# Patient Record
Sex: Female | Born: 1970 | Race: Black or African American | Hispanic: No | Smoking: Never smoker
Health system: Southern US, Community
[De-identification: ages and names within clinical notes are randomized; demographics above are authoritative.]

## PROBLEM LIST (undated history)

## (undated) DIAGNOSIS — Z789 Other specified health status: Secondary | ICD-10-CM

## (undated) HISTORY — PX: TUBAL LIGATION: SHX77

---

## 2004-03-31 ENCOUNTER — Emergency Department (HOSPITAL_COMMUNITY): Admission: EM | Admit: 2004-03-31 | Discharge: 2004-03-31 | Payer: Self-pay | Admitting: Emergency Medicine

## 2004-04-17 ENCOUNTER — Emergency Department (HOSPITAL_COMMUNITY): Admission: EM | Admit: 2004-04-17 | Discharge: 2004-04-17 | Payer: Self-pay | Admitting: Emergency Medicine

## 2004-05-12 ENCOUNTER — Emergency Department (HOSPITAL_COMMUNITY): Admission: EM | Admit: 2004-05-12 | Discharge: 2004-05-13 | Payer: Self-pay | Admitting: Emergency Medicine

## 2004-08-29 ENCOUNTER — Emergency Department (HOSPITAL_COMMUNITY): Admission: EM | Admit: 2004-08-29 | Discharge: 2004-08-30 | Payer: Self-pay | Admitting: Emergency Medicine

## 2005-12-19 ENCOUNTER — Emergency Department (HOSPITAL_COMMUNITY): Admission: EM | Admit: 2005-12-19 | Discharge: 2005-12-19 | Payer: Self-pay | Admitting: Emergency Medicine

## 2006-07-05 ENCOUNTER — Emergency Department (HOSPITAL_COMMUNITY): Admission: EM | Admit: 2006-07-05 | Discharge: 2006-07-05 | Payer: Self-pay | Admitting: Emergency Medicine

## 2006-10-01 ENCOUNTER — Emergency Department (HOSPITAL_COMMUNITY): Admission: EM | Admit: 2006-10-01 | Discharge: 2006-10-01 | Payer: Self-pay | Admitting: Emergency Medicine

## 2007-01-07 ENCOUNTER — Emergency Department (HOSPITAL_COMMUNITY): Admission: EM | Admit: 2007-01-07 | Discharge: 2007-01-08 | Payer: Self-pay | Admitting: Emergency Medicine

## 2007-01-08 ENCOUNTER — Ambulatory Visit (HOSPITAL_COMMUNITY): Admission: RE | Admit: 2007-01-08 | Discharge: 2007-01-08 | Payer: Self-pay | Admitting: Emergency Medicine

## 2007-08-04 ENCOUNTER — Emergency Department (HOSPITAL_COMMUNITY): Admission: EM | Admit: 2007-08-04 | Discharge: 2007-08-04 | Payer: Self-pay | Admitting: Emergency Medicine

## 2007-11-24 ENCOUNTER — Emergency Department (HOSPITAL_COMMUNITY): Admission: EM | Admit: 2007-11-24 | Discharge: 2007-11-24 | Payer: Self-pay | Admitting: Family Medicine

## 2007-12-15 ENCOUNTER — Ambulatory Visit: Payer: Self-pay | Admitting: Nurse Practitioner

## 2007-12-15 DIAGNOSIS — T7840XA Allergy, unspecified, initial encounter: Secondary | ICD-10-CM | POA: Insufficient documentation

## 2007-12-15 DIAGNOSIS — T781XXA Other adverse food reactions, not elsewhere classified, initial encounter: Secondary | ICD-10-CM

## 2008-01-05 ENCOUNTER — Ambulatory Visit: Payer: Self-pay | Admitting: Nurse Practitioner

## 2008-01-05 ENCOUNTER — Other Ambulatory Visit: Admission: RE | Admit: 2008-01-05 | Discharge: 2008-01-05 | Payer: Self-pay | Admitting: Internal Medicine

## 2008-01-05 LAB — CONVERTED CEMR LAB
Bilirubin Urine: NEGATIVE
Blood in Urine, dipstick: NEGATIVE
Glucose, Urine, Semiquant: NEGATIVE
KOH Prep: NEGATIVE
Ketones, urine, test strip: NEGATIVE
Nitrite: NEGATIVE
Specific Gravity, Urine: >=1.03
Urobilinogen, UA: 0.2
WBC Urine, dipstick: NEGATIVE
pH: 5

## 2008-01-12 ENCOUNTER — Encounter (INDEPENDENT_AMBULATORY_CARE_PROVIDER_SITE_OTHER): Payer: Self-pay | Admitting: Nurse Practitioner

## 2008-01-12 DIAGNOSIS — E7800 Pure hypercholesterolemia, unspecified: Secondary | ICD-10-CM

## 2008-01-12 LAB — CONVERTED CEMR LAB
ALT: 18 units/L (ref 0–35)
AST: 17 units/L (ref 0–37)
Albumin: 4.2 g/dL (ref 3.5–5.2)
Alkaline Phosphatase: 51 units/L (ref 39–117)
BUN: 10 mg/dL (ref 6–23)
Basophils Absolute: 0 10*3/uL (ref 0.0–0.1)
Basophils Relative: 1 % (ref 0–1)
CO2: 23 meq/L (ref 19–32)
Calcium: 9.1 mg/dL (ref 8.4–10.5)
Chlamydia, DNA Probe: NEGATIVE
Chloride: 107 meq/L (ref 96–112)
Cholesterol: 223 mg/dL — ABNORMAL HIGH (ref 0–200)
Creatinine, Ser: 0.92 mg/dL (ref 0.40–1.20)
Eosinophils Absolute: 0 10*3/uL (ref 0.0–0.7)
Eosinophils Relative: 2 % (ref 0–5)
GC Probe Amp, Genital: NEGATIVE
Glucose, Bld: 76 mg/dL (ref 70–99)
HCT: 37.4 % (ref 36.0–46.0)
HDL: 63 mg/dL (ref >39)
Hemoglobin: 12.2 g/dL (ref 12.0–15.0)
LDL Cholesterol: 141 mg/dL — ABNORMAL HIGH (ref 0–99)
Lymphocytes Relative: 48 % — ABNORMAL HIGH (ref 12–46)
Lymphs Abs: 1.2 10*3/uL (ref 0.7–4.0)
MCHC: 32.6 g/dL (ref 30.0–36.0)
MCV: 93.3 fL (ref 78.0–100.0)
Monocytes Absolute: 0.2 10*3/uL (ref 0.1–1.0)
Monocytes Relative: 7 % (ref 3–12)
Neutro Abs: 1.1 10*3/uL — ABNORMAL LOW (ref 1.7–7.7)
Neutrophils Relative %: 42 % — ABNORMAL LOW (ref 43–77)
Platelets: 261 10*3/uL (ref 150–400)
Potassium: 4.4 meq/L (ref 3.5–5.3)
RBC: 4.01 M/uL (ref 3.87–5.11)
RDW: 13.3 % (ref 11.5–15.5)
Sodium: 142 meq/L (ref 135–145)
TSH: 1.216 microintl units/mL (ref 0.350–5.50)
Total Bilirubin: 1.6 mg/dL — ABNORMAL HIGH (ref 0.3–1.2)
Total CHOL/HDL Ratio: 3.5
Total Protein: 7 g/dL (ref 6.0–8.3)
Triglycerides: 94 mg/dL (ref <150)
VLDL: 19 mg/dL (ref 0–40)
WBC: 2.6 10*3/uL — ABNORMAL LOW (ref 4.0–10.5)

## 2008-02-22 ENCOUNTER — Ambulatory Visit: Payer: Self-pay | Admitting: Nurse Practitioner

## 2008-03-11 ENCOUNTER — Emergency Department (HOSPITAL_COMMUNITY): Admission: EM | Admit: 2008-03-11 | Discharge: 2008-03-11 | Payer: Self-pay | Admitting: Emergency Medicine

## 2008-03-24 ENCOUNTER — Ambulatory Visit: Payer: Self-pay | Admitting: Nurse Practitioner

## 2008-03-24 DIAGNOSIS — R5381 Other malaise: Secondary | ICD-10-CM

## 2008-03-24 DIAGNOSIS — R5383 Other fatigue: Secondary | ICD-10-CM

## 2008-03-24 DIAGNOSIS — N926 Irregular menstruation, unspecified: Secondary | ICD-10-CM | POA: Insufficient documentation

## 2008-03-24 DIAGNOSIS — R110 Nausea: Secondary | ICD-10-CM

## 2008-03-24 LAB — CONVERTED CEMR LAB
Beta hcg, urine, semiquantitative: NEGATIVE
Bilirubin Urine: NEGATIVE
Blood in Urine, dipstick: NEGATIVE
Glucose, Urine, Semiquant: NEGATIVE
Nitrite: NEGATIVE
Specific Gravity, Urine: >=1.03
Urobilinogen, UA: 1
WBC Urine, dipstick: NEGATIVE
pH: 5

## 2008-03-28 ENCOUNTER — Encounter (INDEPENDENT_AMBULATORY_CARE_PROVIDER_SITE_OTHER): Payer: Self-pay | Admitting: Nurse Practitioner

## 2008-03-28 LAB — CONVERTED CEMR LAB
Basophils Absolute: 0 10*3/uL (ref 0.0–0.1)
Basophils Relative: 1 % (ref 0–1)
Calcium: 10.1 mg/dL (ref 8.4–10.5)
Eosinophils Absolute: 0.1 10*3/uL (ref 0.0–0.7)
Eosinophils Relative: 2 % (ref 0–5)
HCT: 37.4 % (ref 36.0–46.0)
Hemoglobin: 12.4 g/dL (ref 12.0–15.0)
Iron: 62 ug/dL (ref 42–145)
Lymphocytes Relative: 45 % (ref 12–46)
Lymphs Abs: 1.8 10*3/uL (ref 0.7–4.0)
MCHC: 33.2 g/dL (ref 30.0–36.0)
MCV: 93 fL (ref 78.0–100.0)
Monocytes Absolute: 0.2 10*3/uL (ref 0.1–1.0)
Monocytes Relative: 6 % (ref 3–12)
Neutro Abs: 1.8 10*3/uL (ref 1.7–7.7)
Neutrophils Relative %: 47 % (ref 43–77)
Platelets: 298 10*3/uL (ref 150–400)
RBC: 4.02 M/uL (ref 3.87–5.11)
RDW: 13.1 % (ref 11.5–15.5)
Retic Ct Pct: 1.1 % (ref 0.4–3.1)
Saturation Ratios: 20 % (ref 20–55)
TIBC: 317 ug/dL (ref 250–470)
UIBC: 255 ug/dL
WBC: 3.9 10*3/uL — ABNORMAL LOW (ref 4.0–10.5)

## 2008-05-24 ENCOUNTER — Ambulatory Visit: Payer: Self-pay | Admitting: Obstetrics and Gynecology

## 2008-05-24 ENCOUNTER — Encounter: Payer: Self-pay | Admitting: Obstetrics and Gynecology

## 2008-05-30 ENCOUNTER — Ambulatory Visit (HOSPITAL_COMMUNITY): Admission: RE | Admit: 2008-05-30 | Discharge: 2008-05-30 | Payer: Self-pay | Admitting: Obstetrics and Gynecology

## 2008-07-17 ENCOUNTER — Emergency Department (HOSPITAL_COMMUNITY): Admission: EM | Admit: 2008-07-17 | Discharge: 2008-07-17 | Payer: Self-pay | Admitting: Family Medicine

## 2008-07-31 ENCOUNTER — Ambulatory Visit: Payer: Self-pay | Admitting: Nurse Practitioner

## 2008-07-31 LAB — CONVERTED CEMR LAB
Beta hcg, urine, semiquantitative: NEGATIVE
Bilirubin Urine: NEGATIVE
Blood in Urine, dipstick: NEGATIVE
Glucose, Urine, Semiquant: NEGATIVE
Ketones, urine, test strip: NEGATIVE
Nitrite: NEGATIVE
Protein, U semiquant: NEGATIVE
Specific Gravity, Urine: >=1.03
Urobilinogen, UA: 0.2
WBC Urine, dipstick: NEGATIVE
pH: 5

## 2008-12-12 ENCOUNTER — Emergency Department (HOSPITAL_COMMUNITY): Admission: EM | Admit: 2008-12-12 | Discharge: 2008-12-12 | Payer: Self-pay | Admitting: Emergency Medicine

## 2008-12-12 DIAGNOSIS — K5732 Diverticulitis of large intestine without perforation or abscess without bleeding: Secondary | ICD-10-CM | POA: Insufficient documentation

## 2009-01-15 ENCOUNTER — Ambulatory Visit: Payer: Self-pay | Admitting: Nurse Practitioner

## 2009-02-21 ENCOUNTER — Ambulatory Visit: Payer: Self-pay | Admitting: Nurse Practitioner

## 2009-02-21 ENCOUNTER — Encounter (INDEPENDENT_AMBULATORY_CARE_PROVIDER_SITE_OTHER): Payer: Self-pay | Admitting: Nurse Practitioner

## 2009-02-21 ENCOUNTER — Other Ambulatory Visit: Admission: RE | Admit: 2009-02-21 | Discharge: 2009-02-21 | Payer: Self-pay | Admitting: Family Medicine

## 2009-02-21 LAB — CONVERTED CEMR LAB
Ketones, urine, test strip: NEGATIVE
Nitrite: NEGATIVE
Urobilinogen, UA: 0.2

## 2009-02-22 ENCOUNTER — Encounter (INDEPENDENT_AMBULATORY_CARE_PROVIDER_SITE_OTHER): Payer: Self-pay | Admitting: Nurse Practitioner

## 2009-02-22 LAB — CONVERTED CEMR LAB
AST: 29 units/L (ref 0–37)
Alkaline Phosphatase: 59 units/L (ref 39–117)
Basophils Absolute: 0 10*3/uL (ref 0.0–0.1)
Calcium: 9.3 mg/dL (ref 8.4–10.5)
Cholesterol: 220 mg/dL — ABNORMAL HIGH (ref 0–200)
GC Probe Amp, Genital: NEGATIVE
Glucose, Bld: 88 mg/dL (ref 70–99)
HCT: 35.2 % — ABNORMAL LOW (ref 36.0–46.0)
HDL: 63 mg/dL (ref >39)
Hemoglobin: 12 g/dL (ref 12.0–15.0)
Lymphocytes Relative: 43 % (ref 12–46)
Monocytes Absolute: 0.2 10*3/uL (ref 0.1–1.0)
Monocytes Relative: 6 % (ref 3–12)
Neutro Abs: 1.7 10*3/uL (ref 1.7–7.7)
Platelets: 221 10*3/uL (ref 150–400)
Potassium: 4.2 meq/L (ref 3.5–5.3)
Sodium: 137 meq/L (ref 135–145)
Total CHOL/HDL Ratio: 3.5
VLDL: 17 mg/dL (ref 0–40)
WBC: 3.6 10*3/uL — ABNORMAL LOW (ref 4.0–10.5)

## 2009-02-23 ENCOUNTER — Ambulatory Visit: Payer: Self-pay | Admitting: Nurse Practitioner

## 2009-02-28 ENCOUNTER — Ambulatory Visit: Payer: Self-pay | Admitting: Nurse Practitioner

## 2009-02-28 DIAGNOSIS — D72829 Elevated white blood cell count, unspecified: Secondary | ICD-10-CM | POA: Insufficient documentation

## 2009-03-15 LAB — CONVERTED CEMR LAB
Anti Nuclear Antibody(ANA): NEGATIVE
Eosinophils Absolute: 0.1 10*3/uL (ref 0.0–0.7)
Eosinophils Relative: 2 % (ref 0–5)
HCT: 35 % — ABNORMAL LOW (ref 36.0–46.0)
Lymphocytes Relative: 47 % — ABNORMAL HIGH (ref 12–46)
Lymphs Abs: 1.9 10*3/uL (ref 0.7–4.0)
MCHC: 34.9 g/dL (ref 30.0–36.0)
MCV: 89.5 fL (ref 78.0–100.0)
Monocytes Absolute: 0.3 10*3/uL (ref 0.1–1.0)
Monocytes Relative: 8 % (ref 3–12)
RBC: 3.91 M/uL (ref 3.87–5.11)
Vit D, 25-Hydroxy: 16 ng/mL — ABNORMAL LOW (ref 30–89)
WBC: 4.1 10*3/uL (ref 4.0–10.5)

## 2009-05-24 ENCOUNTER — Ambulatory Visit: Payer: Self-pay | Admitting: Nurse Practitioner

## 2009-05-24 DIAGNOSIS — E559 Vitamin D deficiency, unspecified: Secondary | ICD-10-CM | POA: Insufficient documentation

## 2009-05-24 DIAGNOSIS — M779 Enthesopathy, unspecified: Secondary | ICD-10-CM | POA: Insufficient documentation

## 2009-09-27 ENCOUNTER — Ambulatory Visit: Payer: Self-pay | Admitting: Nurse Practitioner

## 2009-09-28 ENCOUNTER — Encounter (INDEPENDENT_AMBULATORY_CARE_PROVIDER_SITE_OTHER): Payer: Self-pay | Admitting: *Deleted

## 2009-09-28 LAB — CONVERTED CEMR LAB: Vit D, 25-Hydroxy: 27 ng/mL — ABNORMAL LOW (ref 30–89)

## 2009-10-08 ENCOUNTER — Telehealth (INDEPENDENT_AMBULATORY_CARE_PROVIDER_SITE_OTHER): Payer: Self-pay | Admitting: Nurse Practitioner

## 2009-10-15 ENCOUNTER — Emergency Department (HOSPITAL_COMMUNITY): Admission: EM | Admit: 2009-10-15 | Discharge: 2009-10-15 | Payer: Self-pay | Admitting: Family Medicine

## 2009-11-02 ENCOUNTER — Emergency Department (HOSPITAL_COMMUNITY): Admission: EM | Admit: 2009-11-02 | Discharge: 2009-11-02 | Payer: Self-pay | Admitting: Family Medicine

## 2009-11-05 ENCOUNTER — Emergency Department (HOSPITAL_COMMUNITY): Admission: EM | Admit: 2009-11-05 | Discharge: 2009-11-05 | Payer: Self-pay | Admitting: Family Medicine

## 2009-11-12 ENCOUNTER — Emergency Department (HOSPITAL_COMMUNITY): Admission: EM | Admit: 2009-11-12 | Discharge: 2009-11-12 | Payer: Self-pay | Admitting: Family Medicine

## 2009-11-13 ENCOUNTER — Encounter (INDEPENDENT_AMBULATORY_CARE_PROVIDER_SITE_OTHER): Payer: Self-pay | Admitting: Nurse Practitioner

## 2009-12-20 ENCOUNTER — Ambulatory Visit: Payer: Self-pay | Admitting: Nurse Practitioner

## 2009-12-20 DIAGNOSIS — L988 Other specified disorders of the skin and subcutaneous tissue: Secondary | ICD-10-CM

## 2010-01-11 ENCOUNTER — Ambulatory Visit: Payer: Self-pay | Admitting: Nurse Practitioner

## 2010-01-14 ENCOUNTER — Ambulatory Visit: Payer: Self-pay | Admitting: Nurse Practitioner

## 2010-02-01 ENCOUNTER — Ambulatory Visit: Payer: Self-pay | Admitting: Nurse Practitioner

## 2010-02-01 DIAGNOSIS — R42 Dizziness and giddiness: Secondary | ICD-10-CM

## 2010-02-01 DIAGNOSIS — N800 Endometriosis of uterus: Secondary | ICD-10-CM

## 2010-02-21 ENCOUNTER — Other Ambulatory Visit: Admission: RE | Admit: 2010-02-21 | Discharge: 2010-02-21 | Payer: Self-pay | Admitting: Internal Medicine

## 2010-02-21 ENCOUNTER — Ambulatory Visit: Payer: Self-pay | Admitting: Nurse Practitioner

## 2010-02-21 LAB — CONVERTED CEMR LAB
Bilirubin Urine: NEGATIVE
Glucose, Urine, Semiquant: NEGATIVE
Rapid HIV Screen: NEGATIVE
Specific Gravity, Urine: >=1.03
Urobilinogen, UA: 0.2
pH: 5

## 2010-02-22 ENCOUNTER — Encounter (INDEPENDENT_AMBULATORY_CARE_PROVIDER_SITE_OTHER): Payer: Self-pay | Admitting: Nurse Practitioner

## 2010-02-25 ENCOUNTER — Encounter (INDEPENDENT_AMBULATORY_CARE_PROVIDER_SITE_OTHER): Payer: Self-pay | Admitting: Nurse Practitioner

## 2010-02-25 LAB — CONVERTED CEMR LAB
Basophils Relative: 1 % (ref 0–1)
CO2: 27 meq/L (ref 19–32)
Cholesterol: 252 mg/dL — ABNORMAL HIGH (ref 0–200)
Creatinine, Ser: 0.88 mg/dL (ref 0.40–1.20)
Eosinophils Absolute: 0.1 10*3/uL (ref 0.0–0.7)
Glucose, Bld: 86 mg/dL (ref 70–99)
MCHC: 33.6 g/dL (ref 30.0–36.0)
MCV: 91.9 fL (ref 78.0–100.0)
Monocytes Absolute: 0.2 10*3/uL (ref 0.1–1.0)
Monocytes Relative: 6 % (ref 3–12)
Neutrophils Relative %: 42 % — ABNORMAL LOW (ref 43–77)
RBC: 4.08 M/uL (ref 3.87–5.11)
RDW: 13.2 % (ref 11.5–15.5)
Total Bilirubin: 0.9 mg/dL (ref 0.3–1.2)
Total CHOL/HDL Ratio: 3.8
Triglycerides: 82 mg/dL (ref <150)
VLDL: 16 mg/dL (ref 0–40)

## 2010-04-29 ENCOUNTER — Ambulatory Visit: Payer: Self-pay | Admitting: Nurse Practitioner

## 2010-04-30 ENCOUNTER — Emergency Department (HOSPITAL_COMMUNITY): Admission: EM | Admit: 2010-04-30 | Discharge: 2010-04-30 | Payer: Self-pay | Admitting: Family Medicine

## 2010-04-30 ENCOUNTER — Emergency Department (HOSPITAL_COMMUNITY): Admission: EM | Admit: 2010-04-30 | Discharge: 2010-04-30 | Payer: Self-pay | Admitting: Emergency Medicine

## 2010-05-14 ENCOUNTER — Ambulatory Visit: Payer: Self-pay | Admitting: Nurse Practitioner

## 2010-05-28 ENCOUNTER — Ambulatory Visit: Payer: Self-pay | Admitting: Nurse Practitioner

## 2010-05-28 DIAGNOSIS — M549 Dorsalgia, unspecified: Secondary | ICD-10-CM | POA: Insufficient documentation

## 2010-06-18 ENCOUNTER — Ambulatory Visit: Payer: Self-pay | Admitting: Nurse Practitioner

## 2010-07-01 ENCOUNTER — Encounter: Admission: RE | Admit: 2010-07-01 | Discharge: 2010-09-13 | Payer: Self-pay | Admitting: Internal Medicine

## 2010-07-03 ENCOUNTER — Encounter (INDEPENDENT_AMBULATORY_CARE_PROVIDER_SITE_OTHER): Payer: Self-pay | Admitting: Nurse Practitioner

## 2010-07-09 ENCOUNTER — Ambulatory Visit: Payer: Self-pay | Admitting: Nurse Practitioner

## 2010-07-09 LAB — CONVERTED CEMR LAB
Cholesterol, target level: 200 mg/dL
LDL Goal: 160 mg/dL

## 2010-07-29 ENCOUNTER — Ambulatory Visit: Payer: Self-pay | Admitting: Nurse Practitioner

## 2010-07-29 LAB — CONVERTED CEMR LAB: Beta hcg, urine, semiquantitative: NEGATIVE

## 2010-07-31 ENCOUNTER — Encounter (INDEPENDENT_AMBULATORY_CARE_PROVIDER_SITE_OTHER): Payer: Self-pay | Admitting: Nurse Practitioner

## 2010-09-28 ENCOUNTER — Emergency Department (HOSPITAL_COMMUNITY): Admission: EM | Admit: 2010-09-28 | Discharge: 2010-09-28 | Payer: Self-pay | Admitting: Emergency Medicine

## 2010-11-27 ENCOUNTER — Emergency Department (HOSPITAL_COMMUNITY)
Admission: EM | Admit: 2010-11-27 | Discharge: 2010-11-27 | Payer: Self-pay | Source: Home / Self Care | Admitting: Family Medicine

## 2010-12-31 ENCOUNTER — Emergency Department (HOSPITAL_COMMUNITY)
Admission: EM | Admit: 2010-12-31 | Discharge: 2010-12-31 | Payer: Self-pay | Source: Home / Self Care | Admitting: Family Medicine

## 2011-01-01 ENCOUNTER — Ambulatory Visit (HOSPITAL_COMMUNITY)
Admission: RE | Admit: 2011-01-01 | Discharge: 2011-01-01 | Payer: Self-pay | Source: Home / Self Care | Attending: Family Medicine | Admitting: Family Medicine

## 2011-01-06 LAB — POCT URINALYSIS DIPSTICK
Bilirubin Urine: NEGATIVE
Ketones, ur: NEGATIVE mg/dL
Nitrite: NEGATIVE
Protein, ur: NEGATIVE mg/dL
Specific Gravity, Urine: >=1.03 (ref 1.005–1.030)
Urine Glucose, Fasting: NEGATIVE mg/dL
Urobilinogen, UA: 0.2 mg/dL (ref 0.0–1.0)
pH: 5.5 (ref 5.0–8.0)

## 2011-01-06 LAB — POCT I-STAT, CHEM 8
BUN: 8 mg/dL (ref 6–23)
Calcium, Ion: 1.19 mmol/L (ref 1.12–1.32)
Chloride: 101 mEq/L (ref 96–112)
Creatinine, Ser: 1.2 mg/dL (ref 0.4–1.2)
Glucose, Bld: 85 mg/dL (ref 70–99)
HCT: 42 % (ref 36.0–46.0)
Hemoglobin: 14.3 g/dL (ref 12.0–15.0)
Potassium: 3.6 mEq/L (ref 3.5–5.1)
Sodium: 139 mEq/L (ref 135–145)
TCO2: 31 mmol/L (ref 0–100)

## 2011-01-06 LAB — GC/CHLAMYDIA PROBE AMP, GENITAL
Chlamydia, DNA Probe: NEGATIVE
GC Probe Amp, Genital: NEGATIVE

## 2011-01-06 LAB — WET PREP, GENITAL
Trich, Wet Prep: NONE SEEN
Yeast Wet Prep HPF POC: NONE SEEN

## 2011-01-06 LAB — POCT PREGNANCY, URINE: Preg Test, Ur: NEGATIVE

## 2011-01-07 ENCOUNTER — Telehealth (INDEPENDENT_AMBULATORY_CARE_PROVIDER_SITE_OTHER): Payer: Self-pay | Admitting: Nurse Practitioner

## 2011-01-14 ENCOUNTER — Other Ambulatory Visit (HOSPITAL_COMMUNITY): Payer: Self-pay | Admitting: Obstetrics

## 2011-01-14 ENCOUNTER — Encounter (INDEPENDENT_AMBULATORY_CARE_PROVIDER_SITE_OTHER): Payer: Self-pay | Admitting: Nurse Practitioner

## 2011-01-14 DIAGNOSIS — Z1239 Encounter for other screening for malignant neoplasm of breast: Secondary | ICD-10-CM

## 2011-01-21 NOTE — Assessment & Plan Note (Signed)
Summary: Acute - Abdominal pain/dizziness   Vital Signs:  Patient profile:   40 year old female LMP:     01/15/2010 Weight:      182.7 pounds BMI:     33.27 BSA:     1.85 Temp:     97.6 degrees F oral Pulse rate:   106 / minute Pulse (ortho):   76 / minute Pulse rhythm:   regular Resp:     20 per minute BP sitting:   116 / 69  (left arm) BP standing:   95 / 69 Cuff size:   regular  Vitals Entered ByLevon Hedger (February 01, 2010 11:05 AM)  Serial Vital Signs/Assessments:  Time      Position  BP       Pulse  Resp  Temp     By           Lying LA  95/69    87                    Lehman Prom FNP           Sitting   99/69    81                    Lehman Prom FNP           Standing  16/10    76                    Lehman Prom FNP  Comments: Measurements taken by Levon Hedger By: Lehman Prom FNP   CC: pt not doing well having dizzy spells not able to keep down certain foods, stomach pain Is Patient Diabetic? No Pain Assessment Patient in pain? no       Does patient need assistance? Functional Status Self care Ambulation Normal LMP (date): 01/15/2010 LMP - Character: light Menarche (age onset years): 12    Menstrual flow (days): 5-6 Enter LMP: 01/15/2010 Last PAP Result  Specimen Adequacy: Satisfactory for evaluation.   Interpretation/Result:Negative for intraepithelial Lesion or Malignancy.      CC:  pt not doing well having dizzy spells not able to keep down certain foods and stomach pain.  History of Present Illness:  Pt into the office with complaints of abdominal pain. Left upper quadrant certain foods makes her have nausea. -gas -bloating -dyuria -discharge -diarrhea +daily BM for small amounts intermittent, not all the time and unable to recall what makes it better or worse s/p tubal ligation  Dizziness - usually from sitting to standing no upper respiratory symptoms  -cough -sneeze -sore throat -ear pain lasts for few  seconds then subsides    Habits & Providers  Alcohol-Tobacco-Diet     Alcohol drinks/day: 0     Tobacco Status: never  Exercise-Depression-Behavior     Does Patient Exercise: yes     Type of exercise: walking     Drug Use: no     Seat Belt Use: 100     Sun Exposure: occasionally  Problems Prior to Update: 1)  Other Specified Disorder of Skin  (ICD-709.8) 2)  Vitamin D Deficiency  (ICD-268.9) 3)  Tendinitis  (ICD-726.90) 4)  Leukocytosis  (ICD-288.60) 5)  Routine Gynecological Examination  (ICD-V72.31) 6)  Diverticulitis of Colon  (ICD-562.11) 7)  Pregnancy Examination or Test Positive Result  (ICD-V72.42) 8)  Nausea  (ICD-787.02) 9)  Irregular Menses  (ICD-626.4) 10)  Fatigue  (ICD-780.79) 11)  Hypercholesterolemia  (ICD-272.0) 12)  Health  Maintenance Exam  (ICD-V70.0) 13)  Other Adverse Food Reactions Nec  (ICD-995.7) 14)  Allergic Reaction  (ICD-995.3) 15)  Family History Diabetes 1st Degree Relative  (ICD-V18.0)  Medications Prior to Update: 1)  Benadryl 25 Mg  Tabs (Diphenhydramine Hcl) .Marland Kitchen.. 1 Tablet By Mouth As Needed For Hives 2)  Multivitamins   Tabs (Multiple Vitamin) .Marland Kitchen.. 1 Tablet By Mouth Daily 3)  Vitamin B12 100 Mcg  Tabs (Cyanocobalamin) .Marland Kitchen.. 1 Tablet By Mouth Daily 4)  Vitamin D 16109 Unit  Caps (Ergocalciferol) .Marland Kitchen.. 1 Tablet By Mouth Weekly 5)  Naproxen 500 Mg Tabs (Naproxen) .Marland Kitchen.. 1 Tablet By Mouth Two Times A Day For Inflammation 6)  Cyclobenzaprine Hcl 5 Mg Tabs (Cyclobenzaprine Hcl) .... One Tablet By Mouth Nightly As Needed For Pain  Allergies: 1)  ! * Tomatos 2)  ! * Onions 3)  ! * Hives 4)  ! * Eggs  Review of Systems General:  Denies fever; +dizziness. CV:  Denies chest pain or discomfort. Resp:  Denies cough. GI:  Complains of abdominal pain; denies constipation, nausea, and vomiting. GU:  Denies dysuria and urinary frequency.  Physical Exam  General:  alert.   Head:  normocephalic.   Lungs:  normal breath sounds.   Heart:  normal  rate and regular rhythm.   Abdomen:  BS x 4 no tenderness with palpation Neurologic:  alert & oriented X3.     Impression & Recommendations:  Problem # 1:  ABDOMINAL PAIN (ICD-789.00) advised pt to add more fiber to her diet will f/u in 2 weeks, if still present will get abd ultrasound  Problem # 2:  DIZZINESS (ICD-780.4) no orthostasis advised pt to maintain hydration The following medications were removed from the medication list:    Benadryl 25 Mg Tabs (Diphenhydramine hcl) .Marland Kitchen... 1 tablet by mouth as needed for hives  Complete Medication List: 1)  Multivitamins Tabs (Multiple vitamin) .Marland Kitchen.. 1 tablet by mouth daily 2)  Vitamin B12 100 Mcg Tabs (Cyanocobalamin) .Marland Kitchen.. 1 tablet by mouth daily 3)  Vitamin D 60454 Unit Caps (Ergocalciferol) .Marland Kitchen.. 1 tablet by mouth weekly 4)  Naproxen 500 Mg Tabs (Naproxen) .Marland Kitchen.. 1 tablet by mouth two times a day for inflammation 5)  Cyclobenzaprine Hcl 5 Mg Tabs (Cyclobenzaprine hcl) .... One tablet by mouth nightly as needed for pain 6)  Miralax Powd (Polyethylene glycol 3350) .... Mix 1 capful with 8 oz of water or juice daily  Patient Instructions: 1)  Take miralax mixed with 8 oz glasses of water/juice daily. 2)  Take this daily. 3)  Eat fiber rich foods such as raisins, fiber one, oatmeal. 4)  Dizziness may be due to dehydration.  You will need to be sure to drink plenty of water or juice at least 8 oz per day. 5)  May also be due to fluid in your ear due to allergies. 6)  Assess symptoms and follow up in 2 weeks to see if they have improved or resolved.   7)  Physical exam due 03/2010. Prescriptions: MIRALAX  POWD (POLYETHYLENE GLYCOL 3350) Mix 1 capful with 8 oz of water or juice daily  #257ml x 0   Entered and Authorized by:   Lehman Prom FNP   Signed by:   Lehman Prom FNP on 02/01/2010   Method used:   Print then Give to Patient   RxID:   [id number]

## 2011-01-21 NOTE — Assessment & Plan Note (Signed)
Summary: ? Pregnancy   Vital Signs:  Patient profile:   40 year old female LMP:     04/13/2010 Weight:      177.9 pounds Temp:     98.2 degrees F Pulse rate:   88 / minute Pulse rhythm:   regular Resp:     18 per minute BP sitting:   109 / 73  (left arm) Cuff size:   regular  Vitals Entered By: Vesta Mixer CMA (Apr 29, 2010 9:34 AM) CC: Pt had a positive home preg test and wants to do another one here. Is Patient Diabetic? No Pain Assessment Patient in pain? yes     Location: abdomen Intensity: 3  Does patient need assistance? Ambulation Normal LMP (date): 04/13/2010 LMP - Character: light Menarche (age onset years): 12    Menstrual flow (days): 5-6 Enter LMP: 04/13/2010 Last PAP Result  Specimen Adequacy: Satisfactory for evaluation.   Interpretation/Result:Negative for intraepithelial Lesion or Malignancy.      CC:  Pt had a positive home preg test and wants to do another one here.Marland Kitchen  History of Present Illness:  Pt into the office to get pregnancy test. Reports that she has done a pregnancy test at home and it was positive. +nausea and vomiting during the month of April ? breast tenderness as she has this all the time LMP April 23-25th(only 2 days); usually last 5 days No spotting since LMP. Some abdominal cramping. No current method of birth control.  "I wouldn't mind getting preganant again" Pt was already taking vitamins daily. Pt's youngest child is 44 years old.  she also has a 23 year old grandchild.  Pt is engaged and had set a wedding date of July 2011 but reports that has been postponed    Allergies (verified): 1)  ! * Tomatos 2)  ! * Onions 3)  ! * Hives 4)  ! * Eggs  Review of Systems General:  Complains of fatigue; denies fever. CV:  Denies chest pain or discomfort. Resp:  Denies cough. GI:  Complains of nausea and vomiting.  Physical Exam  General:  alert.   Head:  normocephalic.   Ears:  ear piercing(s) noted.   Lungs:  normal  breath sounds.   Heart:  normal rate and regular rhythm.   Msk:  normal ROM.   Neurologic:  alert & oriented X3.     Impression & Recommendations:  Problem # 1:  IRREGULAR MENSES (ICD-626.4) urine preganancy in house is negative Unlikely pt is pregnant given that she has not missed any periods ? psychological Orders: Urine Pregnancy Test  [phone]) T-Pregnancy (Serum), Qual.  [phone])  Complete Medication List: 1)  Multivitamins Tabs (Multiple vitamin) .Marland Kitchen.. 1 tablet by mouth daily 2)  Vitamin B12 100 Mcg Tabs (Cyanocobalamin) .Marland Kitchen.. 1 tablet by mouth daily 3)  Miralax Powd (Polyethylene glycol 3350) .... Mix 1 capful with 8 oz of water or juice daily 4)  Pravastatin Sodium 20 Mg Tabs (Pravastatin sodium) .... One tablet by mouth nightly for cholesterol  Patient Instructions: 1)  urine preganacy negative today. 2)  Will check serum (blood) pregnancy.   3)  You will be notified of the results on tomorrow. 4)  If negative, schedule a visit in 2 weeks for determine cause for fatigue. ? depression  Laboratory Results   Urine Tests  Date/Time Received: Apr 29, 2010 10:01 AM     Urine HCG: negative

## 2011-01-21 NOTE — Assessment & Plan Note (Signed)
Summary: Acute - Back pain   Vital Signs:  Patient profile:   40 year old female LMP:     06/12/2010 Weight:      175.6 pounds BMI:     31.98 Temp:     97.5 degrees F oral Pulse rate:   80 / minute Pulse rhythm:   regular Resp:     20 per minute BP sitting:   90 / 60  (left arm) Cuff size:   regular  Vitals Entered By: Levon Hedger (June 18, 2010 10:53 AM) CC: pain in her back that has not stopped any since her last visit, Back Pain Is Patient Diabetic? No Pain Assessment Patient in pain? yes     Location: back Intensity: 7 Onset of pain  Constant  Does patient need assistance? Functional Status Self care Ambulation Normal LMP (date): 06/12/2010 LMP - Character: light Menarche (age onset years): 12    Menstrual flow (days): 5-6 Enter LMP: 06/12/2010 Last PAP Result  Specimen Adequacy: Satisfactory for evaluation.   Interpretation/Result:Negative for intraepithelial Lesion or Malignancy.      CC:  pain in her back that has not stopped any since her last visit and Back Pain.  History of Present Illness:  Pt into the office with continued back pain noted during last visit on 05/28/2010 for which she was given an anti-inflammatory and muscle relaxer which has not helped No additional trauma however during last visit reports that she was lifting some young children and swing them around while playing which she attributed to cause of back pain  Back Pain History:      The patient's back pain has been present for < 6 weeks.  The pain is located in the lower back region and does not radiate below the knees.  She states this is not work related.  She states that she has no prior history of back pain.  The patient has not had any recent physical therapy for her back pain.        Description of injury in patient's own words:  see previous H&P.        Other comments:  She takes the diclofenac and she reports that pain does not improve.  She takes it twice per day.     Critical Exclusionary Diagnosis Criteria (CEDC) for Back Pain:      The patient denies a history of previous trauma.  She has no prior history of spinal surgery.  There are no symptoms to suggest infection, cancer, cauda equina, or psychosocial factors for back pain.  Other positive CEDC factors include low back pain worse with activity.     Habits & Providers  Alcohol-Tobacco-Diet     Alcohol drinks/day: 0     Tobacco Status: never  Exercise-Depression-Behavior     Does Patient Exercise: yes     Exercise Counseling: to improve exercise regimen     Type of exercise: walking     Depression Counseling: not indicated; screening negative for depression     Drug Use: no     Seat Belt Use: 100     Sun Exposure: occasionally  Allergies (verified): 1)  ! * Tomatos 2)  ! * Onions 3)  ! * Hives 4)  ! * Eggs  Review of Systems General:  Denies fever. CV:  Denies chest pain or discomfort. Resp:  Denies cough. GI:  Denies abdominal pain. MS:  Complains of low back pain.  Physical Exam  General:  alert.   Head:  normocephalic.  Ears:  ear piercing(s) noted.   Neck:  supple.   Lungs:  normal breath sounds.   Heart:  normal rate and regular rhythm.     Detailed Back/Spine Exam  Lumbosacral Exam:  Inspection-deformity:    Normal Palpation-spinal tenderness:  Abnormal    Location:  L4-L5 Sitting Straight Leg Raise:    Right:  negative    Left:  negative   Impression & Recommendations:  Problem # 1:  BACK PAIN (ICD-724.5)  will refer pt to physical therapy advsied pt to apply warm compresses to back may continue below meds  Her updated medication list for this problem includes:    Diclofenac Sodium 75 Mg Tbec (Diclofenac sodium) ..... One tablet by mouth two times a day as needed for pain    Soma 350 Mg Tabs (Carisoprodol) ..... One tablet by mouth nightly as needed for muscles  Orders: Physical Therapy Referral (PT)  Complete Medication List: 1)  Multivitamins  Tabs (Multiple vitamin) .Marland Kitchen.. 1 tablet by mouth daily 2)  Vitamin B12 100 Mcg Tabs (Cyanocobalamin) .Marland Kitchen.. 1 tablet by mouth daily 3)  Miralax Powd (Polyethylene glycol 3350) .... Mix 1 capful with 8 oz of water or juice daily 4)  Pravastatin Sodium 20 Mg Tabs (Pravastatin sodium) .... One tablet by mouth nightly for cholesterol 5)  Savella 50 Mg Tabs (Milnacipran hcl) .... One tablet by mouth two times a day for mood 6)  Diclofenac Sodium 75 Mg Tbec (Diclofenac sodium) .... One tablet by mouth two times a day as needed for pain 7)  Soma 350 Mg Tabs (Carisoprodol) .... One tablet by mouth nightly as needed for muscles  Patient Instructions: 1)  You will be referred to physical therapy for your lower back. 2)  You can continue to take diclofenac and soma as previously ordered. 3)  Remember to avoid activities that cause you to do a lot of lifting. 4)  Follow up as previously scheduled

## 2011-01-21 NOTE — Miscellaneous (Signed)
Summary: Rehab Report//[facility]  Rehab Report//[facility]CHAR[facility]   Imported By: Arta Bruce 10/08/2010 12:25:54  _____________________________________________________________________  External Attachment:    Type:   Image     Comment:   External Document

## 2011-01-21 NOTE — Assessment & Plan Note (Signed)
Summary: Abnormal Menses   Vital Signs:  Patient profile:   40 year old female LMP:     06/10/2010 Weight:      175.4 pounds Temp:     97.9 degrees F oral Pulse rate:   80 / minute Pulse rhythm:   regular Resp:     20 per minute BP sitting:   98 / 70  (left arm) Cuff size:   regular  Vitals Entered By: Levon Hedger (July 29, 2010 3:46 PM) Is Patient Diabetic? No Pain Assessment Patient in pain? no       Does patient need assistance? Functional Status Self care Ambulation Normal LMP (date): 06/10/2010 LMP - Character: light Menarche (age onset years): 12    Menstrual flow (days): 5-6 Enter LMP: 06/10/2010 Last PAP Result  Specimen Adequacy: Satisfactory for evaluation.   Interpretation/Result:Negative for intraepithelial Lesion or Malignancy.      History of Present Illness:  Pt into the office for menstrual irregularities LMP June 20th - normal, lasted for 4-5 days. July - cramping but no actual flow during the month of July. Irregular menses for which pt has presented to this office with before.  Fiance present today with pt  Allergies (verified): 1)  ! * Tomatos 2)  ! * Onions 3)  ! * Hives 4)  ! * Eggs  Review of Systems General:  Denies loss of appetite; +breast tenderness. CV:  Denies chest pain or discomfort. Resp:  Denies cough. GI:  Denies abdominal pain, nausea, and vomiting. GU:  Denies abnormal vaginal bleeding and discharge. Psych:  Complains of irritability.  Physical Exam  General:  alert.   Head:  normocephalic.   Lungs:  normal breath sounds.   Heart:  normal rate and regular rhythm.   Abdomen:  normal bowel sounds.   Msk:  normal ROM.   Neurologic:  alert & oriented X3.     Impression & Recommendations:  Problem # 1:  IRREGULAR MENSES (ICD-626.4) urine pregnancy negative today advised pt to keep a menstrual log if no menses in 3 months will need to check other causes such as thyroid, pituitary Orders: UA Dipstick w/o  Micro (manual) (16109) Urine Pregnancy Test  (60454)  Complete Medication List: 1)  Multivitamins Tabs (Multiple vitamin) .Marland Kitchen.. 1 tablet by mouth daily 2)  Vitamin B12 100 Mcg Tabs (Cyanocobalamin) .Marland Kitchen.. 1 tablet by mouth daily 3)  Miralax Powd (Polyethylene glycol 3350) .... Mix 1 capful with 8 oz of water or juice daily 4)  Pravastatin Sodium 20 Mg Tabs (Pravastatin sodium) .... One tablet by mouth nightly for cholesterol 5)  Diclofenac Sodium 75 Mg Tbec (Diclofenac sodium) .... One tablet by mouth two times a day as needed for pain 6)  Soma 350 Mg Tabs (Carisoprodol) .... One tablet by mouth nightly as needed for muscles  Patient Instructions: 1)  Your urine pregnancy is negative today. 2)  Reasons why you missed cycles may be stress, cysts on ovaries and early menopause. 3)  If you have not seen your period by September this inform this office  Laboratory Results   Urine Tests  Date/Time Received: July 29, 2010 4:01 PM     Urine HCG: negative Comments: U/A not done

## 2011-01-21 NOTE — Assessment & Plan Note (Signed)
Summary: Fatigue   Vital Signs:  Patient profile:   40 year old female LMP:     05/12/2010 Weight:      176.1 pounds BMI:     32.07 BSA:     1.82 Temp:     97.8 degrees F oral Pulse rate:   96 / minute Pulse rhythm:   regular Resp:     20 per minute BP sitting:   93 / 62  (left arm) Cuff size:   regular  Vitals Entered By: Levon Hedger (May 14, 2010 11:38 AM) CC: pt is still not being able to eat and nausea...pt is still feeling tired and she is concerned and wants to know what could be causing it Is Patient Diabetic? No Pain Assessment Patient in pain? no       Does patient need assistance? Functional Status Self care Ambulation Normal LMP (date): 05/12/2010 LMP - Character: light Menarche (age onset years): 12    Menstrual flow (days): 5-6 Enter LMP: 05/12/2010 Last PAP Result  Specimen Adequacy: Satisfactory for evaluation.   Interpretation/Result:Negative for intraepithelial Lesion or Malignancy.      CC:  pt is still not being able to eat and nausea...pt is still feeling tired and she is concerned and wants to know what could be causing it.  History of Present Illness:  Pt into the office for 2 week f/u seen in this office 2 weeks about requesting pregnancy test due to nausea, vomiting and fatigue results negative pt has since been to the Urgent care for nausea and vomiting.  Radiology testing done which was normal. 1 pound weight loss since last visit  Allergies (verified): 1)  ! * Tomatos 2)  ! * Onions 3)  ! * Hives 4)  ! * Eggs  Review of Systems General:  Complains of fatigue. CV:  Denies chest pain or discomfort. Resp:  Denies cough. GI:  Complains of nausea and vomiting; denies abdominal pain.  Physical Exam  General:  alert.   Head:  normocephalic.   Neurologic:  gait normal.   Skin:  color normal.   Psych:  flat affect   Impression & Recommendations:  Problem # 1:  FATIGUE (ICD-780.79) ? if chronic will start on savella  (samples given)  Complete Medication List: 1)  Multivitamins Tabs (Multiple vitamin) .Marland Kitchen.. 1 tablet by mouth daily 2)  Vitamin B12 100 Mcg Tabs (Cyanocobalamin) .Marland Kitchen.. 1 tablet by mouth daily 3)  Miralax Powd (Polyethylene glycol 3350) .... Mix 1 capful with 8 oz of water or juice daily 4)  Pravastatin Sodium 20 Mg Tabs (Pravastatin sodium) .... One tablet by mouth nightly for cholesterol 5)  Savella Titration Pack 12.5 & 25 & 50 Mg Misc (Milnacipran hcl) .... Use as directed  Patient Instructions: 1)  Follow up in this office in 2 weeks with n.martin,fnp 2)  Take medications as directed on starter packet

## 2011-01-21 NOTE — Assessment & Plan Note (Signed)
Summary: F/u Back pain   Vital Signs:  Patient profile:   40 year old female LMP:     06/10/2010 Weight:      177.2 pounds BMI:     32.27 Temp:     98.2 degrees F oral Pulse rate:   80 / minute Pulse rhythm:   regular Resp:     20 per minute BP sitting:   92 / 64  (left arm) Cuff size:   regular  Vitals Entered By: Levon Hedger (July 09, 2010 11:13 AM) CC: follow-up visit 6 weeks, Back Pain, Depression, Lipid Management Is Patient Diabetic? No Pain Assessment Patient in pain? no       Does patient need assistance? Functional Status Self care Ambulation Normal LMP (date): 06/10/2010 LMP - Character: light Menarche (age onset years): 12    Menstrual flow (days): 5-6 Enter LMP: 06/10/2010 Last PAP Result  Specimen Adequacy: Satisfactory for evaluation.   Interpretation/Result:Negative for intraepithelial Lesion or Malignancy.      CC:  follow-up visit 6 weeks, Back Pain, Depression, and Lipid Management.  History of Present Illness:  Pt into the office for f/u on meds- She was started on Savella on 05/14/2010 and she took for about 4 weeks and tolerated well.  She actually had a f/u appt here on 06/18/2010 and pt was again tolerating medications well.  She states that shortly after that visit she started with extreme mood swings, serious thoughts of rage and extreme rage.  This was also noticed by other family members.   She stopped taking the medications about 3 week ago and reports that side effects of mood swings as subsided. She does still continue with fatigue but she also does not have good sleep habits. For instance, she was up until 2:00AM this morning reading a book.  no acute complaints today  Back Pain History:      The patient's back pain has been present for < 6 weeks.  The pain is located in the lower back region and does not radiate below the knees.  The patient has had a recent course of physical therapy.        Other comments:  Pt has started physical  therapy as ordered and it is going well.    Lipid Management History:      Negative NCEP/ATP III risk factors include female age less than 36 years old, no history of early menopause without estrogen hormone replacement, non-diabetic, HDL cholesterol greater than 60, no family history for ischemic heart disease, non-tobacco-user status, non-hypertensive, no ASHD (atherosclerotic heart disease), no prior stroke/TIA, no peripheral vascular disease, and no history of aortic aneurysm.        The patient states that she does not know about the "Therapeutic Lifestyle Change" diet.  The patient does not know about adjunctive measures for cholesterol lowering.  She expresses no side effects from her lipid-lowering medication.  Comments include: pt is taking meds as ordered.  The patient denies any symptoms to suggest myopathy or liver disease.       Allergies (verified): 1)  ! * Tomatos 2)  ! * Onions 3)  ! * Hives 4)  ! * Eggs  Review of Systems General:  Complains of fatigue and sleep disorder. CV:  Denies chest pain or discomfort. Resp:  Denies cough. GI:  Denies abdominal pain, nausea, and vomiting. MS:  Complains of low back pain; improved with physical therapy.  Physical Exam  General:  alert.   Head:  normocephalic.  Lungs:  normal breath sounds.   Heart:  normal rate and regular rhythm.   Abdomen:  normal bowel sounds.   Msk:  up to the exam table Neurologic:  alert & oriented X3.   Skin:  color normal.   Psych:  Oriented X3.     Impression & Recommendations:  Problem # 1:  FATIGUE (ICD-780.79) pt has stopped taking savella advised pt to practice good sleep hygiene  Problem # 2:  BACK PAIN (ICD-724.5) pt advised to continue her physical therapy for all the sessions Her updated medication list for this problem includes:    Diclofenac Sodium 75 Mg Tbec (Diclofenac sodium) ..... One tablet by mouth two times a day as needed for pain    Soma 350 Mg Tabs (Carisoprodol) .....  One tablet by mouth nightly as needed for muscles  Complete Medication List: 1)  Multivitamins Tabs (Multiple vitamin) .Marland Kitchen.. 1 tablet by mouth daily 2)  Vitamin B12 100 Mcg Tabs (Cyanocobalamin) .Marland Kitchen.. 1 tablet by mouth daily 3)  Miralax Powd (Polyethylene glycol 3350) .... Mix 1 capful with 8 oz of water or juice daily 4)  Pravastatin Sodium 20 Mg Tabs (Pravastatin sodium) .... One tablet by mouth nightly for cholesterol 5)  Diclofenac Sodium 75 Mg Tbec (Diclofenac sodium) .... One tablet by mouth two times a day as needed for pain 6)  Soma 350 Mg Tabs (Carisoprodol) .... One tablet by mouth nightly as needed for muscles  Lipid Assessment/Plan:      Based on NCEP/ATP III, the patient's risk factor category is "0-1 risk factors".  The patient's lipid goals are as follows: Total cholesterol goal is 200; LDL cholesterol goal is 160; HDL cholesterol goal is 40; Triglyceride goal is 150.    Patient Instructions: 1)  Continue going to physical therapy and complete all your session 2)  Follow up in office as needed.

## 2011-01-21 NOTE — Assessment & Plan Note (Signed)
Summary: Medication f/u & Back pain   Vital Signs:  Patient profile:   41 year old female LMP:     05/13/2010 Weight:      177.0 pounds BMI:     32.23 Temp:     97.6 degrees F oral Pulse rate:   80 / minute Pulse rhythm:   regular Resp:     20 per minute BP sitting:   87 / 64  (left arm) Cuff size:   regular  Vitals Entered By: Levon Hedger (May 28, 2010 11:18 AM) CC: follow-up visit med review, Back Pain, Back Pain, Depression Is Patient Diabetic? No Pain Assessment Patient in pain? yes     Location: back  Does patient need assistance? Functional Status Self care Ambulation Normal LMP (date): 05/13/2010 LMP - Character: light Menarche (age onset years): 12    Menstrual flow (days): 5-6 Enter LMP: 05/13/2010 Last PAP Result  Specimen Adequacy: Satisfactory for evaluation.   Interpretation/Result:Negative for intraepithelial Lesion or Malignancy.      CC:  follow-up visit med review, Back Pain, Back Pain, and Depression.  History of Present Illness:  Pt into the office for 2 week follow up - She was started on Savella. She was given a starter pack of medication Pt is tolerating well.        This is a 40 year old woman who presents with Back Pain.  The symptoms began 4 days ago.  The intensity is described as a 6 out of 10.  Pt has been using a heating pad, soaked in the bathtub with epison salt. Previous back pain many years ago but started about 4 days ago after she was playing outside with the children. The children were toddlers and she was lifting them up in the air. She has spent the last 4 days in bed.  Just yesterday she got to the point that she is able to move around.  The pain is located in the right low back and left low back.  The pain began suddenly.  The pain is made worse by activity.  The pain is made better by inactivity.    Back Pain History:      The patient's back pain started approximately 05/23/2010.  The pain is located in the lower back  region and does not radiate below the knees.  She states this is not work related.  The patient has not had any recent physical therapy for her back pain.    Critical Exclusionary Diagnosis Criteria (CEDC) for Back Pain:      The patient denies a history of previous trauma.  She has no prior history of spinal surgery.  There are no symptoms to suggest infection, cancer, cauda equina, or psychosocial factors for back pain.  Other positive CEDC factors include low back pain worse with activity.    Depression History:      Positive alarm features for depression include fatigue (loss of energy).  However, she denies recurrent thoughts of death or suicide.        The patient denies that she feels like life is not worth living, denies that she wishes that she were dead, and denies that she has thought about ending her life.          Allergies (verified): 1)  ! * Tomatos 2)  ! * Onions 3)  ! * Hives 4)  ! * Eggs  Review of Systems CV:  Denies chest pain or discomfort. Resp:  Denies cough. GI:  Denies abdominal pain, nausea, and vomiting. GU:  Denies dysuria. MS:  Complains of low back pain. Neuro:  Denies tingling.  Physical Exam  General:  alert.   Head:  normocephalic.   Ears:  ear piercing(s) noted.   Msk:  up to the exam table Neurologic:  alert & oriented X3.   Psych:  Oriented X3.     Detailed Back/Spine Exam  Lumbosacral Exam:  Inspection-deformity:    Normal Palpation-spinal tenderness:  Abnormal    Location:  L4-L5 Sitting Straight Leg Raise:    Right:  negative    Left:  negative   Impression & Recommendations:  Problem # 1:  BACK PAIN (ICD-724.5) likely muscle strain will treat with anti-inflammatory and muscle relaxers continue heat and soaks.  decrease activity Her updated medication list for this problem includes:    Diclofenac Sodium 75 Mg Tbec (Diclofenac sodium) ..... One tablet by mouth two times a day as needed for pain    Soma 350 Mg Tabs  (Carisoprodol) ..... One tablet by mouth nightly as needed for muscles  Problem # 2:  FATIGUE (ICD-780.79) savella is doing well  advised pt to continue to take daily will need at least 8 weeks for maximum results  Complete Medication List: 1)  Multivitamins Tabs (Multiple vitamin) .Marland Kitchen.. 1 tablet by mouth daily 2)  Vitamin B12 100 Mcg Tabs (Cyanocobalamin) .Marland Kitchen.. 1 tablet by mouth daily 3)  Miralax Powd (Polyethylene glycol 3350) .... Mix 1 capful with 8 oz of water or juice daily 4)  Pravastatin Sodium 20 Mg Tabs (Pravastatin sodium) .... One tablet by mouth nightly for cholesterol 5)  Savella 50 Mg Tabs (Milnacipran hcl) .... One tablet by mouth two times a day for mood 6)  Diclofenac Sodium 75 Mg Tbec (Diclofenac sodium) .... One tablet by mouth two times a day as needed for pain 7)  Soma 350 Mg Tabs (Carisoprodol) .... One tablet by mouth nightly as needed for muscles  Patient Instructions: 1)  Fatigue/mood - continue to take the savella 50mg  by mouth two times a day.  Once you finish the samples then get the prescription filled.  Remember that this will still take time to build up in your system at most 8 weeks before you will see the maximum benefit from it so keep taking it. 2)  Back pain - you have pulled a muscle in your back. 3)  Keep applying the heat and bathing in the tub. 4)  Follow up in 6 weeks for medication review - savella. Prescriptions: SOMA 350 MG TABS (CARISOPRODOL) One tablet by mouth nightly as needed for muscles  #30 x 0   Entered and Authorized by:   Lehman Prom FNP   Signed by:   Lehman Prom FNP on 05/28/2010   Method used:   Print then Give to Patient   RxID:   [id number] DICLOFENAC SODIUM 75 MG TBEC (DICLOFENAC SODIUM) One tablet by mouth two times a day as needed for pain  #30 x 0   Entered and Authorized by:   Lehman Prom FNP   Signed by:   Lehman Prom FNP on 05/28/2010   Method used:   Print then Give to Patient   RxID:    [id number] SAVELLA 50 MG TABS (MILNACIPRAN HCL) One tablet by mouth two times a day for mood  #60 x 3   Entered and Authorized by:   Lehman Prom FNP   Signed by:   Lehman Prom FNP on 05/28/2010   Method used:  Print then Give to Patient   RxID:   [id number]

## 2011-01-21 NOTE — Progress Notes (Signed)
Summary: Office Visit/DEPRESION SCREENING  Office Visit/DEPRESION SCREENING   Imported By: Arta Bruce 04/22/2010 16:41:16  _____________________________________________________________________  External Attachment:    Type:   Image     Comment:   External Document

## 2011-01-21 NOTE — Letter (Signed)
Summary: DR,AMBER ALLEN  DR,AMBER ALLEN   Imported By: Arta Bruce 02/05/2010 11:55:24  _____________________________________________________________________  External Attachment:    Type:   Image     Comment:   External Document

## 2011-01-21 NOTE — Letter (Signed)
Summary: REFERRAL//PHYSICAL THERAPY/APPT DATE & TIME  REFERRAL//PHYSICAL THERAPY/APPT DATE & TIME   Imported By: Arta Bruce 06/19/2010 12:26:03  _____________________________________________________________________  External Attachment:    Type:   Image     Comment:   External Document

## 2011-01-21 NOTE — Letter (Signed)
Summary: Handout Printed  Printed Handout:  - Muscle Strain (Pulled Muscle) 

## 2011-01-21 NOTE — Assessment & Plan Note (Signed)
Summary: Complete Physical Exam   Vital Signs:  Patient profile:   40 year old female LMP:     02/15/2010 Weight:      176.5 pounds BMI:     32.14 BSA:     1.82 Temp:     97.9 degrees F oral Pulse rate:   61 / minute Pulse rhythm:   regular Resp:     16 per minute BP sitting:   104 / 71  (left arm) Cuff size:   regular  Vitals Entered By: Levon Hedger (February 21, 2010 11:54 AM) CC: CPP Is Patient Diabetic? No Pain Assessment Patient in pain? no       Does patient need assistance? Functional Status Self care Ambulation Normal LMP (date): 02/15/2010 LMP - Character: light Menarche (age onset years): 12    Menstrual flow (days): 5-6 Enter LMP: 02/15/2010 Last PAP Result  Specimen Adequacy: Satisfactory for evaluation.   Interpretation/Result:Negative for intraepithelial Lesion or Malignancy.      CC:  CPP.  History of Present Illness:  Pt into the office for CPE.  Acute illness last with with a  GI virus that she contracted from her grandchild.  improved at this time but still only eating soft foods due to nausea and vomiting associated with the acute illness   Habits & Providers  Alcohol-Tobacco-Diet     Alcohol drinks/day: 0     Tobacco Status: never  Exercise-Depression-Behavior     Does Patient Exercise: yes     Type of exercise: walking     Have you felt down or hopeless? no     Have you felt little pleasure in things? no     Depression Counseling: not indicated; screening negative for depression     Drug Use: no     Seat Belt Use: 100     Sun Exposure: occasionally  Allergies (verified): 1)  ! * Tomatos 2)  ! * Onions 3)  ! * Hives 4)  ! * Eggs  Review of Systems General:  Denies loss of appetite. Eyes:  Denies discharge. ENT:  Denies earache. CV:  Denies chest pain or discomfort. Resp:  Denies cough. GI:  Complains of loss of appetite, nausea, and vomiting; denies abdominal pain. GU:  Denies discharge. MS:  Denies joint pain. Derm:   Denies dryness. Neuro:  Denies headaches. Psych:  Denies anxiety and depression.  Physical Exam  General:  alert.   Head:  normocephalic.   Eyes:  pupils equal and pupils round.   Ears:  Bil TM with bony landmarks present Nose:  no nasal discharge.   Mouth:  fair dentition.   Neck:  supple.   Breasts:  no abnormal thickening.   Lungs:  normal breath sounds.   Heart:  normal rate and regular rhythm.   Abdomen:  soft, non-tender, and normal bowel sounds.   Msk:  normal ROM.   Extremities:  no edema Neurologic:  alert & oriented X3.   Skin:  color normal.   Psych:  Oriented X3.    Pelvic Exam  Vulva:      normal appearance.   Urethra and Bladder:      Urethra--normal.   Vagina:      physiologic discharge.   Cervix:      midposition.   Adnexa:      nontender bilaterally.      Impression & Recommendations:  Problem # 1:  ROUTINE GYNECOLOGICAL EXAMINATION (ICD-V72.31) labs done PAP done rec optho and dental exam Orders: Pap  Smear, Thin Prep ( Collection of) [phone]) KOH/ WET [place] [phone]) T- GC Chlamydia (14782) UA Dipstick w/o Micro (manual) (95621)  Problem # 2:  HYPERCHOLESTEROLEMIA (ICD-272.0) will check labs today Orders: T-Lipid Profile ([id number]) T-Comprehensive Metabolic Panel ([id number])  Complete Medication List: 1)  Multivitamins Tabs (Multiple vitamin) .Marland Kitchen.. 1 tablet by mouth daily 2)  Vitamin B12 100 Mcg Tabs (Cyanocobalamin) .Marland Kitchen.. 1 tablet by mouth daily 3)  Vitamin D 62952 Unit Caps (Ergocalciferol) .Marland Kitchen.. 1 tablet by mouth weekly 4)  Naproxen 500 Mg Tabs (Naproxen) .Marland Kitchen.. 1 tablet by mouth two times a day for inflammation 5)  Cyclobenzaprine Hcl 5 Mg Tabs (Cyclobenzaprine hcl) .... One tablet by mouth nightly as needed for pain 6)  Miralax Powd (Polyethylene glycol 3350) .... Mix 1 capful with 8 oz of water or juice daily  Other Orders: T-CBC w/Diff ([id number]) T-TSH ([id number]) Rapid HIV  (34742)  Patient Instructions: 1)  You will  be informed of any abnormal labs 2)  Follow up as needed  Laboratory Results   Urine Tests  Date/Time Received: February 21, 2010 12:26 PM   Routine Urinalysis   Color: yellow Appearance: Clear Glucose: negative   (Normal Range: Negative) Bilirubin: negative   (Normal Range: Negative) Ketone: negative   (Normal Range: Negative) Spec. Gravity: >=1.030   (Normal Range: 1.003-1.035) Blood: negative   (Normal Range: Negative) pH: 5.0   (Normal Range: 5.0-8.0) Protein: 30   (Normal Range: Negative) Urobilinogen: 0.2   (Normal Range: 0-1) Nitrite: negative   (Normal Range: Negative) Leukocyte Esterace: negative   (Normal Range: Negative)      Other Tests  Rapid HIV: negative Comments: wet prep discarded before provider could view    Prevention & Chronic Care Immunizations   Influenza vaccine: declined, pt allergic to eggs  (12/15/2007)   Influenza vaccine deferral: Refused  (02/21/2010)    Tetanus booster: 12/22/2005: per pt    Pneumococcal vaccine: Not documented  Other Screening   Pap smear:  Specimen Adequacy: Satisfactory for evaluation.   Interpretation/Result:Negative for intraepithelial Lesion or Malignancy.     (02/21/2009)   Pap smear action/deferral: PAP smear done  (02/21/2009)   Pap smear due: 02/22/2011   Smoking status: never  (02/21/2010)  Lipids   Total Cholesterol: 220  (02/21/2009)   Lipid panel action/deferral: Lipid Panel ordered   LDL: 140  (02/21/2009)   LDL Direct: Not documented   HDL: 63  (02/21/2009)   Triglycerides: 86  (02/21/2009)    SGOT (AST): 29  (02/21/2009)   SGPT (ALT): 46  (02/21/2009) CMP ordered    Alkaline phosphatase: 59  (02/21/2009)   Total bilirubin: 1.1  (02/21/2009)  Self-Management Support :    Lipid self-management support: Not documented      Laboratory Results   Urine Tests    Routine Urinalysis   Color: yellow Appearance: Clear Glucose: negative   (Normal Range: Negative) Bilirubin: negative    (Normal Range: Negative) Ketone: negative   (Normal Range: Negative) Spec. Gravity: >=1.030   (Normal Range: 1.003-1.035) Blood: negative   (Normal Range: Negative) pH: 5.0   (Normal Range: 5.0-8.0) Protein: 30   (Normal Range: Negative) Urobilinogen: 0.2   (Normal Range: 0-1) Nitrite: negative   (Normal Range: Negative) Leukocyte Esterace: negative   (Normal Range: Negative)      Other Tests  Rapid HIV: negative Comments: wet prep discarded before provider could view

## 2011-01-21 NOTE — Letter (Signed)
Summary: Lipid Letter  [facility]  [address] [place], [place] [address]   Phone: [phone]  Fax: [phone]    02/25/2010  Toni Chang [address] [place], [place]  [address]  Dear Toni Chang:  We have carefully reviewed your last lipid profile from 02/21/2010 and the results are noted below with a summary of recommendations for lipid management.    Cholesterol:       252     Goal: less than 200   HDL "good" Cholesterol:   66     Goal: greater than 40   LDL "bad" Cholesterol:   170     Goal: less than 130   Triglycerides:       82     Goal: less than 150  Labs done during your recent office visit show that your cholesterol is high.  You should have been contacted by this office about the need to start medication.  You should get your cholesterol rechecked 8 weeks after starting medications. Pap Smear results _________________________.    Current Medications: 1)    Multivitamins   Tabs (Multiple vitamin) .Marland Kitchen.. 1 tablet by mouth daily 2)    Miralax  Powd (Polyethylene glycol 3350) .... Mix 1 capful with 8 oz of water or juice daily 3)    Pravastatin Sodium 20 Mg Tabs (Pravastatin sodium) .... One tablet by mouth nightly for cholesterol  If you have any questions, please call. We appreciate being able to work with you.   Sincerely,    [facility] Toni Prom FNP

## 2011-01-21 NOTE — Miscellaneous (Signed)
Summary: Rehab Report//INITIAL SUMMARY  Rehab Report//INITIAL SUMMARY   Imported By: Arta Bruce 07/03/2010 10:49:27  _____________________________________________________________________  External Attachment:    Type:   Image     Comment:   External Document

## 2011-01-22 ENCOUNTER — Ambulatory Visit: Admit: 2011-01-22 | Payer: Self-pay | Admitting: Nurse Practitioner

## 2011-01-22 ENCOUNTER — Encounter: Payer: Self-pay | Admitting: Nurse Practitioner

## 2011-01-22 HISTORY — PX: DILATION AND CURETTAGE OF UTERUS: SHX78

## 2011-01-23 ENCOUNTER — Inpatient Hospital Stay (HOSPITAL_COMMUNITY): Admission: RE | Admit: 2011-01-23 | Payer: Self-pay | Source: Ambulatory Visit

## 2011-01-23 NOTE — Letter (Signed)
Summary: FAXED PAP RECORDS TO [facility]  FAXED PAP RECORDS TO [facility]   Imported By: Arta Bruce 01/14/2011 15:35:41  _____________________________________________________________________  External Attachment:    Type:   Image     Comment:   External Document

## 2011-01-23 NOTE — Progress Notes (Signed)
Summary: DIAGNOSED  W/ENDOMETROSIS  Phone Note Call from Patient Call back at Home Phone [phone]   Reason for Call: Talk to Nurse Summary of Call: [facility] PT. MS BROCK CALLED TO LET YOU KNOW THAT SHE WENT TO [facility] ON 01/10 AND THEY HAD HER TO DO A ULTRASOUND AND SAYS THAT SHE WAS DIAGNOISED WITH ENDOMETROSIS AND SHE HAS AN APPT TO SEE DR MARSHALL ON 01/24 AT 2.  Initial call taken by: Leodis Rains,  January 07, 2011 3:42 PM  Follow-up for Phone Call        thanks for the update. 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

## 2011-01-27 ENCOUNTER — Ambulatory Visit (HOSPITAL_COMMUNITY)
Admission: RE | Admit: 2011-01-27 | Discharge: 2011-01-27 | Disposition: A | Discharge status: Home / Self Care | Payer: Medicaid Other | Source: Ambulatory Visit | Attending: Obstetrics | Admitting: Obstetrics

## 2011-01-27 DIAGNOSIS — Z1239 Encounter for other screening for malignant neoplasm of breast: Secondary | ICD-10-CM

## 2011-01-27 DIAGNOSIS — Z1231 Encounter for screening mammogram for malignant neoplasm of breast: Secondary | ICD-10-CM | POA: Insufficient documentation

## 2011-01-29 NOTE — Assessment & Plan Note (Signed)
Summary: F/u Urgent Care visit   Vital Signs:  Patient profile:   40 year old female LMP:     10/2010 Weight:      186.2 pounds BMI:     33.91 Temp:     97.7 degrees F oral Pulse rate:   72 / minute Pulse rhythm:   regular Resp:     16 per minute BP sitting:   94 / 60  (left arm) Cuff size:   regular  Vitals Entered By: Levon Hedger (January 22, 2011 10:27 AM)  Nutrition Counseling: Patient's BMI is greater than 25 and therefore counseled on weight management options. CC: consult about health for PCP, Lipid Management Is Patient Diabetic? No Pain Assessment Patient in pain? no       Does patient need assistance? Functional Status Self care Ambulation Normal LMP (date): 10/2010 LMP - Character: light Menarche (age onset years): 12    Menstrual flow (days): 5-6 Enter LMP: 10/2010 Last PAP Result  Specimen Adequacy: Satisfactory for evaluation.   Interpretation/Result:Negative for intraepithelial Lesion or Malignancy.      CC:  consult about health for PCP and Lipid Management.  History of Present Illness:  Pt into the office for f/u. She had some complaints about her period  August - spotty menses September - normal October - spotty and only last for a few days November - regular December - no flow January - still with abnormal flow, light for 3-4 days then got heavy on 5-6 days then stopped for a day then returned for 2 days with extreme pain. She went to the Urgent Care. Dx with BV - meds given She was referred for further testing with the GYN. Ultrasound done and there was concern for endometriosis. Mammogram ordered for tomorrow. She has also been schedule for a D&C and hysterogram  Lipid Management History:      Negative NCEP/ATP III risk factors include female age less than 60 years old, no history of early menopause without estrogen hormone replacement, non-diabetic, HDL cholesterol greater than 60, no family history for ischemic heart disease,  non-tobacco-user status, non-hypertensive, no ASHD (atherosclerotic heart disease), no prior stroke/TIA, no peripheral vascular disease, and no history of aortic aneurysm.        The patient states that she does not know about the "Therapeutic Lifestyle Change" diet.  Comments include: pt stopped taking her cholesterol meds since 10/2010.     Habits & Providers  Alcohol-Tobacco-Diet     Alcohol drinks/day: 0     Tobacco Status: never  Exercise-Depression-Behavior     Does Patient Exercise: yes     Exercise Counseling: to improve exercise regimen     Type of exercise: walking     Depression Counseling: not indicated; screening negative for depression     Drug Use: no     Seat Belt Use: 100     Sun Exposure: occasionally  Allergies (verified): 1)  ! * Tomatos 2)  ! * Onions 3)  ! * Hives 4)  ! * Eggs  Review of Systems CV:  Denies chest pain or discomfort. Resp:  Denies cough. GI:  Complains of abdominal pain. GU:  Complains of abnormal vaginal bleeding.  Physical Exam  General:  alert.   Head:  normocephalic.   Lungs:  normal breath sounds.   Heart:  normal rate and regular rhythm.   Msk:  normal ROM.   Neurologic:  alert & oriented X3.   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 Hold  Complete Medication List: 1)  Multivitamins Tabs (Multiple vitamin) .Marland Kitchen.. 1 tablet by mouth daily 2)  Vitamin B12 100 Mcg Tabs (Cyanocobalamin) .Marland Kitchen.. 1 tablet by mouth daily 3)  Miralax Powd (Polyethylene glycol 3350) .... Mix 1 capful with 8 oz of water or juice daily 4)   Pravastatin Sodium 20 Mg Tabs (Pravastatin sodium) .... Hold  Lipid Assessment/Plan:      Based on NCEP/ATP III, the patient's risk factor category is "0-1 risk factors".  The patient's lipid goals are as follows: Total cholesterol goal is 200; LDL cholesterol goal is 160; HDL cholesterol goal is 40; Triglyceride goal is 150.    Patient Instructions: 1)  Schedule a fasting lab appointment - lipids (272.0) 2)  No food after midnight before this visit. 3)  Keep your appointments with GYN as scheduled 4)  Keep this provider in the loop with what is going on   Orders Added: 1)  Est. Patient Level III [16109]    Prevention & Chronic Care Immunizations   Influenza vaccine: Refused  (01/22/2011)   Influenza vaccine deferral: Refused  (02/21/2010)    Tetanus booster: 12/22/2005: per pt    Pneumococcal vaccine: Not documented  Other Screening   Pap smear:  Specimen Adequacy: Satisfactory for evaluation.   Interpretation/Result:Negative for intraepithelial Lesion or Malignancy.     (02/21/2010)   Pap smear action/deferral: PAP smear done  (02/21/2009)   Pap smear due: 02/2011    Mammogram: Not documented   Mammogram action/deferral: mammogram not indicated at this time  (02/21/2009)   Smoking status: never  (01/22/2011)  Lipids   Total Cholesterol: 252  (02/21/2010)   Lipid panel action/deferral: Lipid Panel ordered   LDL: 170  (02/21/2010)   LDL Direct: Not documented   HDL: 66  (02/21/2010)   Triglycerides: 82  (02/21/2010)    SGOT (AST): 24  (02/21/2010)   SGPT (ALT): 28  (02/21/2010)   Alkaline phosphatase: 47  (02/21/2010)   Total bilirubin: 0.9  (02/21/2010)  Self-Management Support :    Lipid self-management support: Not documented

## 2011-01-30 ENCOUNTER — Encounter (HOSPITAL_COMMUNITY)
Admission: RE | Admit: 2011-01-30 | Discharge: 2011-01-30 | Disposition: A | Discharge status: Home / Self Care | Payer: Medicaid Other | Source: Ambulatory Visit | Attending: Obstetrics | Admitting: Obstetrics

## 2011-01-30 DIAGNOSIS — Z01812 Encounter for preprocedural laboratory examination: Secondary | ICD-10-CM | POA: Insufficient documentation

## 2011-01-30 LAB — CBC
HCT: 35.6 % — ABNORMAL LOW (ref 36.0–46.0)
Hemoglobin: 12.2 g/dL (ref 12.0–15.0)
MCH: 31.5 pg (ref 26.0–34.0)
MCHC: 34.3 g/dL (ref 30.0–36.0)
MCV: 92 fL (ref 78.0–100.0)

## 2011-02-03 ENCOUNTER — Encounter (INDEPENDENT_AMBULATORY_CARE_PROVIDER_SITE_OTHER): Payer: Self-pay | Admitting: Nurse Practitioner

## 2011-02-03 LAB — CONVERTED CEMR LAB
LDL Cholesterol: 207 mg/dL — ABNORMAL HIGH (ref 0–99)
Total CHOL/HDL Ratio: 4.2
VLDL: 23 mg/dL (ref 0–40)

## 2011-02-05 ENCOUNTER — Ambulatory Visit (HOSPITAL_COMMUNITY)
Admission: RE | Admit: 2011-02-05 | Discharge: 2011-02-05 | Disposition: A | Discharge status: Home / Self Care | Payer: Medicaid Other | Source: Ambulatory Visit | Attending: Obstetrics | Admitting: Obstetrics

## 2011-02-05 ENCOUNTER — Other Ambulatory Visit: Payer: Self-pay | Admitting: Obstetrics

## 2011-02-05 DIAGNOSIS — N938 Other specified abnormal uterine and vaginal bleeding: Secondary | ICD-10-CM | POA: Insufficient documentation

## 2011-02-05 DIAGNOSIS — N949 Unspecified condition associated with female genital organs and menstrual cycle: Secondary | ICD-10-CM | POA: Insufficient documentation

## 2011-02-12 NOTE — Op Note (Addendum)
  NAMESILVANA, HOLECEK                ACCOUNT NO.:  [id number]  MEDICAL RECORD NO.:  [id number]           PATIENT TYPE:  LOCATION:                                 FACILITY:  PHYSICIAN:  Kathreen Cosier, M.D.DATE OF BIRTH:  Sep 03, 1971  DATE OF PROCEDURE:  02/05/2011 DATE OF DISCHARGE:                              OPERATIVE REPORT   PREOPERATIVE DIAGNOSIS:  Dysfunctional uterine bleeding.  POSTOPERATIVE DIAGNOSIS:  Dysfunctional uterine bleeding.  SURGEON:  Kathreen Cosier, MD  ANESTHESIA:  General.  PROCEDURE IN DETAIL:  Under general anesthesia, the patient in lithotomy position, perineum and vagina prepped and draped, bladder emptied with straight catheter.  Bimanual exam revealed uterus to be enlarged with myomas.  The speculum placed in the vagina.  Cervix injected with 10 mL of 1% Xylocaine.  Anterior lip of the cervix grasped with tenaculum. Endocervix curetted, small amount of tissue obtained.  Endometrial cavity sounded 11 cm.  Cervix dilated to #25 Shawnie Pons.  Hysteroscope inserted.  The cavity was normal except for impingement of a small intramural myoma on the cavity.  Apart from that the cavity was normal. The hysteroscope was removed.  Sharp curettage performed.  Moderate amount of tissue obtained.  The patient tolerated the procedure well, taken to recovery room in good condition.          ______________________________ Kathreen Cosier, M.D.     BAM/MEDQ  D:  02/05/2011  T:  02/05/2011  Job:  [id number]  Electronically Signed by Francoise Ceo M.D. on 02/12/2011 08:15:34 AM

## 2011-03-05 LAB — URINALYSIS, ROUTINE W REFLEX MICROSCOPIC
Hgb urine dipstick: NEGATIVE
Protein, ur: NEGATIVE mg/dL
Urobilinogen, UA: 0.2 mg/dL (ref 0.0–1.0)

## 2011-03-05 LAB — URINE CULTURE

## 2011-03-05 LAB — POCT URINALYSIS DIPSTICK
Bilirubin Urine: NEGATIVE
Glucose, UA: NEGATIVE mg/dL
Nitrite: NEGATIVE
pH: 5 (ref 5.0–8.0)

## 2011-03-05 LAB — URINE MICROSCOPIC-ADD ON

## 2011-03-05 LAB — POCT PREGNANCY, URINE: Preg Test, Ur: NEGATIVE

## 2011-03-11 LAB — POCT URINALYSIS DIP (DEVICE)
Ketones, ur: NEGATIVE mg/dL
Protein, ur: NEGATIVE mg/dL
Urobilinogen, UA: 0.2 mg/dL (ref 0.0–1.0)

## 2011-03-11 LAB — DIFFERENTIAL
Basophils Absolute: 0 10*3/uL (ref 0.0–0.1)
Lymphocytes Relative: 42 % (ref 12–46)
Neutro Abs: 1.8 10*3/uL (ref 1.7–7.7)
Neutrophils Relative %: 50 % (ref 43–77)

## 2011-03-11 LAB — HCG, SERUM, QUALITATIVE: Preg, Serum: NEGATIVE

## 2011-03-11 LAB — CBC
Hemoglobin: 11.8 g/dL — ABNORMAL LOW (ref 12.0–15.0)
Platelets: 210 10*3/uL (ref 150–400)
RDW: 12.2 % (ref 11.5–15.5)

## 2011-03-11 LAB — POCT PREGNANCY, URINE: Preg Test, Ur: NEGATIVE

## 2011-03-11 LAB — GC/CHLAMYDIA PROBE AMP, GENITAL
Chlamydia, DNA Probe: NEGATIVE
GC Probe Amp, Genital: NEGATIVE

## 2011-03-11 LAB — WET PREP, GENITAL: Yeast Wet Prep HPF POC: NONE SEEN

## 2011-03-17 ENCOUNTER — Encounter (INDEPENDENT_AMBULATORY_CARE_PROVIDER_SITE_OTHER): Payer: Self-pay | Admitting: Nurse Practitioner

## 2011-03-25 NOTE — Assessment & Plan Note (Signed)
Summary: TB for employment  Nurse Visit   Allergies: 1)  ! * Tomatos 2)  ! * Onions 3)  ! * Hives 4)  ! * Eggs  Immunizations Administered:  PPD Skin Test:    Vaccine Type: PPD    Site: right forearm    Mfr: Sanofi Pasteur    Dose: 0.1 ml    Route: ID    Given by: Hale Drone CMA    Exp. Date: 07/03/2012    Lot #: [id number]  Orders Added: 1)  Est. Patient Nurse visit [09003] 2)  TB Skin Test [69629] 3)  Admin 1st Vaccine [52841]  Appended Document: TB for employment   PPD Results    Results: < 5mm    Interpretation: negative

## 2011-03-26 LAB — CBC
Hemoglobin: 12 g/dL (ref 12.0–15.0)
MCHC: 35 g/dL (ref 30.0–36.0)
MCV: 92.1 fL (ref 78.0–100.0)
RBC: 3.73 MIL/uL — ABNORMAL LOW (ref 3.87–5.11)

## 2011-03-26 LAB — DIFFERENTIAL
Basophils Relative: 1 % (ref 0–1)
Eosinophils Absolute: 0.1 10*3/uL (ref 0.0–0.7)
Monocytes Absolute: 0.3 10*3/uL (ref 0.1–1.0)
Monocytes Relative: 6 % (ref 3–12)
Neutro Abs: 2.6 10*3/uL (ref 1.7–7.7)

## 2011-04-05 ENCOUNTER — Inpatient Hospital Stay (INDEPENDENT_AMBULATORY_CARE_PROVIDER_SITE_OTHER)
Admission: RE | Admit: 2011-04-05 | Discharge: 2011-04-05 | Disposition: A | Discharge status: Home / Self Care | Payer: Medicaid Other | Source: Ambulatory Visit | Attending: Family Medicine | Admitting: Family Medicine

## 2011-04-05 DIAGNOSIS — M25519 Pain in unspecified shoulder: Secondary | ICD-10-CM

## 2011-05-06 NOTE — Group Therapy Note (Signed)
NAME:  Toni Chang, Toni Chang                ACCOUNT NO.:  [id number]   MEDICAL RECORD NO.:  [id number]          PATIENT TYPE:  WOC   LOCATION:  [facility]                   FACILITY:  [facility]   PHYSICIAN:  Argentina Donovan, MD        DATE OF BIRTH:  [birth year]   DATE OF SERVICE:  05/24/2008                                  CLINIC NOTE   The patient is a 40 year old gravida 4, para 4-0-0-4 for tubal ligation  11 years ago and was in for annual exam.  In addition she said that her  last two periods which lasted only 2 days, where she says they normal  last for 5 days, so we are going to get a pregnancy test.   EXAMINATION:  BREASTS:  Symmetrical with no dominant masses.  ABDOMEN:  Soft, nontender.  No masses, organomegaly.  EXTERNAL GENITALIA:  Normal.  BUS within normal limits.  Vagina is clean  and well rugated.  The cervix is clean and parous.  Uterus and adnexa  are normal.   IMPRESSION:  Normal gynecological examination.           ______________________________  Argentina Donovan, MD     PR/MEDQ  D:  05/24/2008  T:  05/24/2008  Job:  [id number]

## 2011-06-20 ENCOUNTER — Encounter (HOSPITAL_COMMUNITY)
Admission: RE | Admit: 2011-06-20 | Discharge: 2011-06-20 | Disposition: A | Discharge status: Home / Self Care | Payer: Medicaid Other | Source: Ambulatory Visit | Attending: Orthopedic Surgery | Admitting: Orthopedic Surgery

## 2011-06-20 LAB — URINALYSIS, ROUTINE W REFLEX MICROSCOPIC
Glucose, UA: NEGATIVE mg/dL
Nitrite: NEGATIVE
Specific Gravity, Urine: 1.029 (ref 1.005–1.030)
pH: 5 (ref 5.0–8.0)

## 2011-06-20 LAB — URINE MICROSCOPIC-ADD ON

## 2011-06-20 LAB — COMPREHENSIVE METABOLIC PANEL
Albumin: 3.5 g/dL (ref 3.5–5.2)
Alkaline Phosphatase: 71 U/L (ref 39–117)
BUN: 8 mg/dL (ref 6–23)
CO2: 27 mEq/L (ref 19–32)
Chloride: 104 mEq/L (ref 96–112)
Creatinine, Ser: 0.8 mg/dL (ref 0.50–1.10)
GFR calc non Af Amer: >60 mL/min (ref >60)
Potassium: 3.8 mEq/L (ref 3.5–5.1)
Total Bilirubin: 0.6 mg/dL (ref 0.3–1.2)

## 2011-06-20 LAB — SURGICAL PCR SCREEN
MRSA, PCR: NEGATIVE
Staphylococcus aureus: NEGATIVE

## 2011-06-20 LAB — CBC
Hemoglobin: 12.5 g/dL (ref 12.0–15.0)
MCH: 31.2 pg (ref 26.0–34.0)
Platelets: 224 10*3/uL (ref 150–400)
RBC: 4.01 MIL/uL (ref 3.87–5.11)
WBC: 3.8 10*3/uL — ABNORMAL LOW (ref 4.0–10.5)

## 2011-06-22 HISTORY — PX: SHOULDER SURGERY: SHX246

## 2011-06-26 ENCOUNTER — Ambulatory Visit (HOSPITAL_COMMUNITY)
Admission: RE | Admit: 2011-06-26 | Discharge: 2011-06-26 | Disposition: A | Discharge status: Home / Self Care | Payer: Medicaid Other | Source: Ambulatory Visit | Attending: Orthopedic Surgery | Admitting: Orthopedic Surgery

## 2011-06-26 DIAGNOSIS — M24119 Other articular cartilage disorders, unspecified shoulder: Secondary | ICD-10-CM | POA: Insufficient documentation

## 2011-06-26 DIAGNOSIS — Z01812 Encounter for preprocedural laboratory examination: Secondary | ICD-10-CM | POA: Insufficient documentation

## 2011-06-26 DIAGNOSIS — M19019 Primary osteoarthritis, unspecified shoulder: Secondary | ICD-10-CM | POA: Insufficient documentation

## 2011-06-26 DIAGNOSIS — M719 Bursopathy, unspecified: Secondary | ICD-10-CM | POA: Insufficient documentation

## 2011-06-26 DIAGNOSIS — M67919 Unspecified disorder of synovium and tendon, unspecified shoulder: Secondary | ICD-10-CM | POA: Insufficient documentation

## 2011-06-26 DIAGNOSIS — M25819 Other specified joint disorders, unspecified shoulder: Secondary | ICD-10-CM | POA: Insufficient documentation

## 2011-06-28 NOTE — Op Note (Signed)
[name], [name]                ACCOUNT NO.:  [id number]  MEDICAL RECORD NO.:  [id number]  LOCATION:  [facility]                         FACILITY:  [facility]  PHYSICIAN:  Vania Rea. Charly Hunton, M.D.  DATE OF BIRTH:  06-13-1971  DATE OF PROCEDURE:  06/26/2011 DATE OF DISCHARGE:  06/26/2011                              OPERATIVE REPORT   PREOPERATIVE DIAGNOSES: 1. Chronic right shoulder impingement syndrome. 2. Right shoulder acromioclavicular joint arthropathy. 3. Possible right shoulder rotator cuff tear.  POSTOPERATIVE DIAGNOSES: 1. Chronic right shoulder impingement syndrome. 2. Right shoulder acromioclavicular joint arthropathy. 3. Right shoulder rotator cuff tear. 4. Right shoulder labral tear.  PROCEDURES: 1. Right shoulder examination under anesthesia. 2. Right shoulder glenohumeral joint diagnostic arthroscopy. 3. Debridement of complex and extensive labral tear. 4. Arthroscopic subacromial depression bursectomy. 5. Arthroscopic distal clavicle resection. 6. Arthroscopic rotator cuff repair using a double row suture bridge     repair construct.  SURGEON:  Vania Rea. Sunaina Ferrando, MD  ASSISTANT:  Lucita Lora. Shuford, PA-C  ANESTHESIA:  General endotracheal as well as an interscalene block.  ESTIMATED BLOOD LOSS:  Minimal.  DRAINS:  None.  HISTORY:  Toni Chang is a 40 year old female who has had chronic right shoulder pain and progressively increasing functional limitations despite prolonged attempts at conservative management.  Her preoperative MRI scan was suggestive of a significant partial if not small full- thickness rotator cuff tear.  Due to her ongoing pain and function limitation, she was brought to the operating room at this time for planned right shoulder arthroscopy as described below.  Preoperatively, had counseled Toni Chang on treatment options as well as risks versus benefits thereof.  Possible surgical complications were reviewed including potential for bleeding,  infection, neurovascular injury, persistent pain, loss of motion, anesthetic complication, recurrence rotator cuff tear, possible need for additional surgery.  She understands and accepts and agrees with our planned procedure.  PROCEDURE IN DETAIL:  After undergoing routine preop evaluation, the patient received prophylactic antibiotics.  An interscalene block was established in holding area by the Anesthesia Department.  Placed supine on the operating table, underwent smooth induction of general endotracheal anesthesia.  Turned to left lateral decubitus position on a beanbag and appropriate padded and protected.  Right shoulder examination under anesthesia revealed full passive motion.  No instability patterns noted.  Right arm was suspended at 7 degrees of abduction with 10 pounds of traction.  The right shoulder girdle region was sterilely prepped and draped in standard fashion.  Time-out was called.  A posterior portal was established at the glenohumeral joint. The anterior portal was established under direct visualization.  The glenohumeral articular surfaces were in overall good condition.  There was a degenerative tear of the superior labrum which was debrided with a shaver back to stable margin.  Biceps anchor was stable.  Biceps tendon was normal caliber.  No instability patterns noted.  Radiographs showed some moderate attenuation with obvious at least partial thickness tearing of all the distal supraspinatus.  We did pass the tag suture 0 PDS at this point.  We then completed the inspection of glenohumeral joint and fluid and instruments were then removed.  The arm was dropped down  to 30 degrees of abduction with arthroscope introduced in the subacromial space to the posterior portal and a direct lateral portal established in the subacromial space.  There was some minimal adhesions and bursitis and a bursectomy was performed.  We did perform a subacromial depression removing  minimal amount of bone from the undersurface of the anterior half of the acromion, but overall subacromial space was fairly well preserved.  We then established portal directly anterior to the distal clavicle, and the distal clavicle resection was performed with a bur.  Care was taken to make sure the entire circumference of the distal clavicle could be visualized to ensure adequate removal of bone.  We then completed the bursectomy.  We carefully probed the rotator cuff in the area surrounding the tag suture and indeed this was essentially full-thickness defect, it was just a thin veil of fibrinous remaining intact in both the bursal and articular sides.  We went ahead and completed the rotator cuff tear and used a shaver to debride the rotator cuff back to healthy margins and had defect approximately 2 cm in width.  An accessory portal device was established and through a stab wound of lateral margin of the acromion, placed an Arthrex PEEK corkscrew suture anchor.  Limbs in the suture anchor were then passed through the free margin of the rotator cuff in horizontal mattress pattern and then these was tied with a sliding locking knot followed by multiple overhand throws and alternating posts. We then created "suture bridge" to push lock suture anchors nicely reinforcing repair and compression of free margin of the rotator cuff against the bony bed on tuberosity.  Suture limbs were clipped.  Final hemostasis was obtained.  Fluid and instruments removed.  Portals closed with Monocryl insertion.  A bulky dry dressing then taped at the right shoulder, right arm was placed in a sling immobilizer and the patient was awakened, extubated, and taken to the recovery room in stable condition.     Vania Rea. Aireana Ryland, M.D.     KMS/MEDQ  D:  06/26/2011  T:  06/27/2011  Job:  [id number]  Electronically Signed by Francena Hanly M.D. on 06/28/2011 10:33:58 AM

## 2011-07-01 ENCOUNTER — Ambulatory Visit: Payer: Medicaid Other | Attending: Orthopedic Surgery | Admitting: Physical Therapy

## 2011-07-01 DIAGNOSIS — M25519 Pain in unspecified shoulder: Secondary | ICD-10-CM | POA: Insufficient documentation

## 2011-07-01 DIAGNOSIS — M25619 Stiffness of unspecified shoulder, not elsewhere classified: Secondary | ICD-10-CM | POA: Insufficient documentation

## 2011-07-01 DIAGNOSIS — IMO0001 Reserved for inherently not codable concepts without codable children: Secondary | ICD-10-CM | POA: Insufficient documentation

## 2011-07-15 ENCOUNTER — Encounter: Payer: Medicaid Other | Admitting: Rehabilitative and Restorative Service Providers"

## 2011-07-22 ENCOUNTER — Ambulatory Visit: Payer: Medicaid Other | Admitting: Rehabilitative and Restorative Service Providers"

## 2011-08-06 ENCOUNTER — Ambulatory Visit: Payer: Medicaid Other | Attending: Orthopedic Surgery | Admitting: Rehabilitative and Restorative Service Providers"

## 2011-08-06 DIAGNOSIS — IMO0001 Reserved for inherently not codable concepts without codable children: Secondary | ICD-10-CM | POA: Insufficient documentation

## 2011-08-06 DIAGNOSIS — M25519 Pain in unspecified shoulder: Secondary | ICD-10-CM | POA: Insufficient documentation

## 2011-08-06 DIAGNOSIS — M25619 Stiffness of unspecified shoulder, not elsewhere classified: Secondary | ICD-10-CM | POA: Insufficient documentation

## 2011-08-27 ENCOUNTER — Ambulatory Visit: Payer: Medicaid Other | Attending: Orthopedic Surgery | Admitting: Rehabilitative and Restorative Service Providers"

## 2011-08-27 DIAGNOSIS — M25619 Stiffness of unspecified shoulder, not elsewhere classified: Secondary | ICD-10-CM | POA: Insufficient documentation

## 2011-08-27 DIAGNOSIS — IMO0001 Reserved for inherently not codable concepts without codable children: Secondary | ICD-10-CM | POA: Insufficient documentation

## 2011-08-27 DIAGNOSIS — M25519 Pain in unspecified shoulder: Secondary | ICD-10-CM | POA: Insufficient documentation

## 2011-09-19 LAB — POCT URINALYSIS DIP (DEVICE)
Glucose, UA: NEGATIVE
Ketones, ur: 15 — AB
Protein, ur: 30 — AB
Urobilinogen, UA: 2 — ABNORMAL HIGH

## 2011-09-19 LAB — WET PREP, GENITAL: Trich, Wet Prep: NONE SEEN

## 2011-09-19 LAB — GC/CHLAMYDIA PROBE AMP, GENITAL: GC Probe Amp, Genital: NEGATIVE

## 2011-09-26 LAB — URINALYSIS, ROUTINE W REFLEX MICROSCOPIC
Ketones, ur: NEGATIVE mg/dL
Leukocytes, UA: NEGATIVE
Nitrite: NEGATIVE
Specific Gravity, Urine: 1.019 (ref 1.005–1.030)
Urobilinogen, UA: 1 mg/dL (ref 0.0–1.0)
pH: 6 (ref 5.0–8.0)

## 2011-09-26 LAB — DIFFERENTIAL
Basophils Absolute: 0 10*3/uL (ref 0.0–0.1)
Basophils Relative: 0 % (ref 0–1)
Lymphocytes Relative: 22 % (ref 12–46)
Neutro Abs: 3.8 10*3/uL (ref 1.7–7.7)

## 2011-09-26 LAB — COMPREHENSIVE METABOLIC PANEL
BUN: 6 mg/dL (ref 6–23)
CO2: 24 mEq/L (ref 19–32)
Chloride: 105 mEq/L (ref 96–112)
Creatinine, Ser: 0.88 mg/dL (ref 0.4–1.2)
GFR calc non Af Amer: >60 mL/min (ref >60)
Glucose, Bld: 93 mg/dL (ref 70–99)
Total Bilirubin: 1.3 mg/dL — ABNORMAL HIGH (ref 0.3–1.2)

## 2011-09-26 LAB — CBC
HCT: 33.6 % — ABNORMAL LOW (ref 36.0–46.0)
Hemoglobin: 11.5 g/dL — ABNORMAL LOW (ref 12.0–15.0)
MCV: 92.7 fL (ref 78.0–100.0)
RBC: 3.63 MIL/uL — ABNORMAL LOW (ref 3.87–5.11)
WBC: 5.6 10*3/uL (ref 4.0–10.5)

## 2011-09-26 LAB — URINE MICROSCOPIC-ADD ON

## 2011-09-29 LAB — WET PREP, GENITAL
Trich, Wet Prep: NONE SEEN
Yeast Wet Prep HPF POC: NONE SEEN

## 2011-09-29 LAB — POCT URINALYSIS DIP (DEVICE)
Operator id: 239701
Protein, ur: NEGATIVE
Specific Gravity, Urine: >=1.03

## 2011-09-29 LAB — GC/CHLAMYDIA PROBE AMP, GENITAL: GC Probe Amp, Genital: NEGATIVE

## 2011-10-06 LAB — URINALYSIS, ROUTINE W REFLEX MICROSCOPIC
Glucose, UA: NEGATIVE
Ketones, ur: NEGATIVE
Nitrite: NEGATIVE
Specific Gravity, Urine: 1.025
pH: 7

## 2011-10-06 LAB — I-STAT 8, (EC8 V) (CONVERTED LAB)
BUN: 8
Chloride: 107
Glucose, Bld: 95
Potassium: 4.2
pCO2, Ven: 51 — ABNORMAL HIGH
pH, Ven: 7.33 — ABNORMAL HIGH

## 2011-10-06 LAB — URINE MICROSCOPIC-ADD ON

## 2011-10-06 LAB — PREGNANCY, URINE: Preg Test, Ur: NEGATIVE

## 2011-10-06 LAB — POCT I-STAT CREATININE: Creatinine, Ser: 1.2

## 2011-12-09 ENCOUNTER — Encounter: Payer: Self-pay | Admitting: Emergency Medicine

## 2011-12-09 ENCOUNTER — Emergency Department (HOSPITAL_COMMUNITY): Payer: Medicaid Other

## 2011-12-09 ENCOUNTER — Other Ambulatory Visit: Payer: Self-pay

## 2011-12-09 ENCOUNTER — Emergency Department (HOSPITAL_COMMUNITY)
Admission: EM | Admit: 2011-12-09 | Discharge: 2011-12-09 | Disposition: A | Discharge status: Home / Self Care | Payer: Medicaid Other | Attending: Emergency Medicine | Admitting: Emergency Medicine

## 2011-12-09 DIAGNOSIS — E669 Obesity, unspecified: Secondary | ICD-10-CM | POA: Insufficient documentation

## 2011-12-09 DIAGNOSIS — R079 Chest pain, unspecified: Secondary | ICD-10-CM | POA: Insufficient documentation

## 2011-12-09 DIAGNOSIS — G529 Cranial nerve disorder, unspecified: Secondary | ICD-10-CM | POA: Insufficient documentation

## 2011-12-09 LAB — CBC
Hemoglobin: 11.9 g/dL — ABNORMAL LOW (ref 12.0–15.0)
MCH: 30.6 pg (ref 26.0–34.0)
MCV: 88.9 fL (ref 78.0–100.0)
RBC: 3.89 MIL/uL (ref 3.87–5.11)

## 2011-12-09 LAB — DIFFERENTIAL
Eosinophils Absolute: 0.1 10*3/uL (ref 0.0–0.7)
Eosinophils Relative: 2 % (ref 0–5)
Lymphs Abs: 1.7 10*3/uL (ref 0.7–4.0)
Monocytes Relative: 7 % (ref 3–12)
Neutrophils Relative %: 41 % — ABNORMAL LOW (ref 43–77)

## 2011-12-09 LAB — POCT I-STAT, CHEM 8
BUN: 9 mg/dL (ref 6–23)
Calcium, Ion: 1.22 mmol/L (ref 1.12–1.32)
Chloride: 103 mEq/L (ref 96–112)
Potassium: 4.3 mEq/L (ref 3.5–5.1)

## 2011-12-09 LAB — TROPONIN I: Troponin I: <0.3 ng/mL (ref <0.30)

## 2011-12-09 MED ORDER — OXYCODONE-ACETAMINOPHEN 5-325 MG PO TABS: 1.0000 | ORAL_TABLET | ORAL | Status: AC | PRN

## 2011-12-09 MED ORDER — OXYCODONE-ACETAMINOPHEN 5-325 MG PO TABS
1.0000 | ORAL_TABLET | Freq: Once | ORAL | Status: AC
  Administered 2011-12-09: 1 via ORAL
  Filled 2011-12-09 (×2): qty 1

## 2011-12-09 MED ORDER — SODIUM CHLORIDE 0.9 % IV BOLUS (SEPSIS)
500.0000 mL | Freq: Once | INTRAVENOUS | Status: AC
  Administered 2011-12-09: 500 mL via INTRAVENOUS

## 2011-12-09 NOTE — ED Provider Notes (Signed)
History     CSN: [id number] Arrival date & time: 12/09/2011  1:01 PM   First MD Initiated Contact with Patient 12/09/11 1516      Chief Complaint  Patient presents with  . Chest Pain    (Consider location/radiation/quality/duration/timing/severity/associated sxs/prior treatment) Patient is a 40 y.o. female presenting with chest pain. The history is provided by the patient.  Chest Pain The chest pain began yesterday. Pertinent negatives for primary symptoms include no fatigue, no shortness of breath, no cough and no abdominal pain.    patient developed chest pain yesterday while helping her son clean his room. She states she hasn't been lifting anything heavy. She states the pain is worse with moving or breathing. It decreases but does not resolve itself. No cough. No fevers. No hemoptysis. She had shoulder surgery in July. She has not had pains like this before. She does not smoke. She does not have any heart problems she knows about. No recent travel. She is obese.   History reviewed. No pertinent past medical history.  Past Surgical History  Procedure Date  . Shoulder surgery     July 2012    No family history on file.  History  Substance Use Topics  . Smoking status: Not on file  . Smokeless tobacco: Not on file  . Alcohol Use:     OB History    Grav Para Term Preterm Abortions TAB SAB Ect Mult Living                  Review of Systems  Constitutional: Negative for chills and fatigue.  HENT: Negative for ear pain.   Respiratory: Negative for cough and shortness of breath.   Cardiovascular: Positive for chest pain.  Gastrointestinal: Negative for abdominal pain.  Genitourinary: Negative for flank pain.  Musculoskeletal: Negative for back pain.  Neurological: Negative for light-headedness and headaches.  Psychiatric/Behavioral: Negative for confusion.    Allergies  Eggs or egg-derived products  Home Medications   Current Outpatient Rx  Name Route Sig  Dispense Refill  . METHOCARBAMOL 500 MG PO TABS Oral Take 500 mg by mouth every 8 (eight) hours as needed. For spasms.     . OXYCODONE-ACETAMINOPHEN 5-325 MG PO TABS Oral Take 1-2 tablets by mouth every 4 (four) hours as needed. For pain.     . OXYCODONE-ACETAMINOPHEN 5-325 MG PO TABS Oral Take 1 tablet by mouth every 4 (four) hours as needed for pain. 10 tablet 0    BP 103/68  Pulse 72  Temp(Src) 97.6 F (36.4 C) (Oral)  Resp 18  SpO2 100%  Physical Exam  Vitals reviewed. Constitutional: She appears well-developed.       Obese   HENT:  Head: Normocephalic and atraumatic.  Eyes: Pupils are equal, round, and reactive to light.  Neck: Normal range of motion.  Cardiovascular: Normal rate and regular rhythm.   Pulmonary/Chest: Effort normal and breath sounds normal. She exhibits tenderness.       Tender left parasternal area. No crepitance. No rash  Abdominal: Soft. Bowel sounds are normal.  Musculoskeletal: Normal range of motion. She exhibits no edema and no tenderness.  Neurological: She is alert. A cranial nerve deficit is present.    ED Course  Procedures (including critical care time)  Labs Reviewed  CBC - Abnormal; Notable for the following:    WBC 3.3 (*)    Hemoglobin 11.9 (*)    HCT 34.6 (*)    All other components within normal limits  DIFFERENTIAL -  Abnormal; Notable for the following:    Neutrophils Relative 41 (*)    Neutro Abs 1.4 (*)    Lymphocytes Relative 50 (*)    All other components within normal limits  D-DIMER, QUANTITATIVE  TROPONIN I  POCT I-STAT, CHEM 8  I-STAT, CHEM 8   Dg Chest 2 View  12/09/2011  *RADIOLOGY REPORT*  Clinical Data: History of chest pain.  CHEST - 2 VIEW  Comparison: 12/19/2005.  Findings: Cardiac silhouette is upper normal size. There is minimal right medial basilar atelectasis.  On the lateral image there are low lung volumes with reticular interstitial pattern within the lungs.  No consolidation or pleural effusion is  evident.    There is moderate scoliosis convexity to the right.  IMPRESSION: Minimal right medial basilar atelectasis. Low lung volumes.  No consolidation or pleural effusion.  Original Report Authenticated By: Crawford Givens, M.D.     1. Chest pain      Date: 12/09/2011  Rate: 81  Rhythm: normal sinus rhythm  QRS Axis: normal  Intervals: normal  ST/T Wave abnormalities: normal  Conduction Disutrbances:none  Narrative Interpretation: early precordial R/S  Old EKG Reviewed: unchanged    Patient has reproducible chest pain. EKG is reassuring. She had been helping someone in the house, but states that with it. She had surgery 6 months ago. The unknown cause of pain. D-dimer was drawn which was negative. She initially was a little hypotensive, his improved with fluids. I doubt cardiac cause of pain. She'll be discharged home.        Juliet Rude. Rubin Payor, MD 12/09/11 Ernestina [place]

## 2011-12-09 NOTE — ED Notes (Signed)
Pt was cleaning house yesterday and started having chest wall pain that hurts when she moves but stops when she sits still.

## 2011-12-25 ENCOUNTER — Encounter (HOSPITAL_COMMUNITY): Payer: Self-pay

## 2011-12-25 ENCOUNTER — Emergency Department (INDEPENDENT_AMBULATORY_CARE_PROVIDER_SITE_OTHER)
Admission: EM | Admit: 2011-12-25 | Discharge: 2011-12-25 | Disposition: A | Discharge status: Home / Self Care | Payer: Medicaid Other | Source: Home / Self Care | Attending: Family Medicine | Admitting: Family Medicine

## 2011-12-25 DIAGNOSIS — R197 Diarrhea, unspecified: Secondary | ICD-10-CM

## 2011-12-25 MED ORDER — PROMETHAZINE HCL 25 MG PO TABS: 25.0000 mg | ORAL_TABLET | Freq: Four times a day (QID) | ORAL | Status: AC | PRN

## 2011-12-25 NOTE — ED Provider Notes (Signed)
History     CSN: [id number]  Arrival date & time 12/25/11  1914   First MD Initiated Contact with Patient 12/25/11 [phone]      Chief Complaint  Patient presents with  . Diarrhea  . Abdominal Pain    (Consider location/radiation/quality/duration/timing/severity/associated sxs/prior treatment) Patient is a 41 y.o. female presenting with diarrhea and abdominal pain. The history is provided by the patient.  Diarrhea The primary symptoms include fatigue, abdominal pain, nausea and diarrhea. Primary symptoms do not include fever, weight loss, vomiting, melena, hematemesis, jaundice or hematochezia. The illness began 3 to 5 days ago. The onset was sudden. The problem has been gradually worsening.  The fatigue began 3 to 5 days ago. The fatigue has been unchanged since its onset.  The abdominal pain began more than 2 days ago. The abdominal pain is generalized. The abdominal pain does not radiate. The severity of the abdominal pain is 2/10. The abdominal pain is relieved by nothing.  Nausea began 3 to 5 days ago.  The diarrhea began 3 to 5 days ago. The diarrhea is watery. The diarrhea occurs 2 to 4 times per day.  The illness does not include bloating.  Abdominal Pain The primary symptoms of the illness include abdominal pain, fatigue, nausea and diarrhea. The primary symptoms of the illness do not include fever, vomiting, hematemesis or hematochezia.    History reviewed. No pertinent past medical history.  Past Surgical History  Procedure Date  . Shoulder surgery     July 2012    History reviewed. No pertinent family history.  History  Substance Use Topics  . Smoking status: Never Smoker   . Smokeless tobacco: Not on file  . Alcohol Use: No    OB History    Grav Para Term Preterm Abortions TAB SAB Ect Mult Living                  Review of Systems  Constitutional: Positive for fatigue. Negative for fever and weight loss.  HENT: Negative.   Respiratory: Negative.     Cardiovascular: Negative.   Gastrointestinal: Positive for nausea, abdominal pain and diarrhea. Negative for vomiting, melena, hematochezia, bloating, hematemesis and jaundice.  Genitourinary: Negative.   Musculoskeletal: Negative.   Skin: Negative.     Allergies  Eggs or egg-derived products  Home Medications   Current Outpatient Rx  Name Route Sig Dispense Refill  . METHOCARBAMOL 500 MG PO TABS Oral Take 500 mg by mouth every 8 (eight) hours as needed. For spasms.     . OXYCODONE-ACETAMINOPHEN 5-325 MG PO TABS Oral Take 1-2 tablets by mouth every 4 (four) hours as needed. For pain.     Marland Kitchen PROMETHAZINE HCL 25 MG PO TABS Oral Take 1 tablet (25 mg total) by mouth every 6 (six) hours as needed for nausea. 15 tablet 0    BP 102/70  Pulse 80  Temp(Src) 98.3 F (36.8 C) (Oral)  Resp 16  SpO2 100%  LMP 12/23/2011  Physical Exam  Nursing note and vitals reviewed. Constitutional: She is oriented to person, place, and time. She appears well-developed and well-nourished. No distress.  HENT:  Head: Normocephalic.  Mouth/Throat: Oropharynx is clear and moist.  Neck: Normal range of motion. Neck supple. No thyromegaly present.  Cardiovascular: Normal rate, regular rhythm and normal heart sounds.   Pulmonary/Chest: Effort normal and breath sounds normal. No respiratory distress.  Abdominal: Soft. Bowel sounds are normal. She exhibits no distension and no mass. There is no tenderness. There is  no rebound and no guarding.  Lymphadenopathy:    She has no cervical adenopathy.  Neurological: She is alert and oriented to person, place, and time.  Skin: Skin is warm and dry.  Psychiatric: She has a normal mood and affect.    ED Course  Procedures (including critical care time)  Labs Reviewed - No data to display No results found.   1. Diarrhea       MDM          Randa Spike, MD 12/25/11 [phone]

## 2011-12-25 NOTE — ED Notes (Signed)
C/o nausea, diarrhea and stomach cramps since Tuesday.  Denies vomiting or fever.  Last diarrhea was at 6 am today. States she had diarrhea every time after eating and drinking yesterday. Has had gingerale this am without diarrhea.

## 2012-01-27 ENCOUNTER — Other Ambulatory Visit (HOSPITAL_COMMUNITY): Payer: Self-pay | Admitting: Obstetrics

## 2012-01-27 DIAGNOSIS — Z1231 Encounter for screening mammogram for malignant neoplasm of breast: Secondary | ICD-10-CM

## 2012-02-23 ENCOUNTER — Ambulatory Visit (HOSPITAL_COMMUNITY): Payer: Medicaid Other

## 2012-03-01 ENCOUNTER — Ambulatory Visit (HOSPITAL_COMMUNITY)
Admission: RE | Admit: 2012-03-01 | Discharge: 2012-03-01 | Disposition: A | Discharge status: Home / Self Care | Payer: Medicaid Other | Source: Ambulatory Visit | Attending: Obstetrics | Admitting: Obstetrics

## 2012-03-01 DIAGNOSIS — Z1231 Encounter for screening mammogram for malignant neoplasm of breast: Secondary | ICD-10-CM

## 2012-03-10 ENCOUNTER — Inpatient Hospital Stay (HOSPITAL_COMMUNITY): Payer: Medicaid Other

## 2012-03-10 ENCOUNTER — Inpatient Hospital Stay (HOSPITAL_COMMUNITY)
Admission: AD | Admit: 2012-03-10 | Discharge: 2012-03-10 | Disposition: A | Discharge status: Home / Self Care | Payer: Medicaid Other | Source: Ambulatory Visit | Attending: Obstetrics | Admitting: Obstetrics

## 2012-03-10 ENCOUNTER — Encounter (HOSPITAL_COMMUNITY): Payer: Self-pay | Admitting: *Deleted

## 2012-03-10 ENCOUNTER — Encounter (HOSPITAL_COMMUNITY): Payer: Self-pay

## 2012-03-10 ENCOUNTER — Emergency Department (HOSPITAL_COMMUNITY)
Admission: EM | Admit: 2012-03-10 | Discharge: 2012-03-10 | Disposition: A | Discharge status: Nursing Home (Includes ICF) | Payer: Medicaid Other | Source: Home / Self Care | Attending: Emergency Medicine | Admitting: Emergency Medicine

## 2012-03-10 DIAGNOSIS — N939 Abnormal uterine and vaginal bleeding, unspecified: Secondary | ICD-10-CM

## 2012-03-10 DIAGNOSIS — N949 Unspecified condition associated with female genital organs and menstrual cycle: Secondary | ICD-10-CM | POA: Insufficient documentation

## 2012-03-10 DIAGNOSIS — N390 Urinary tract infection, site not specified: Secondary | ICD-10-CM | POA: Insufficient documentation

## 2012-03-10 DIAGNOSIS — D259 Leiomyoma of uterus, unspecified: Secondary | ICD-10-CM | POA: Insufficient documentation

## 2012-03-10 DIAGNOSIS — D219 Benign neoplasm of connective and other soft tissue, unspecified: Secondary | ICD-10-CM

## 2012-03-10 DIAGNOSIS — N739 Female pelvic inflammatory disease, unspecified: Secondary | ICD-10-CM

## 2012-03-10 DIAGNOSIS — N800 Endometriosis of uterus: Secondary | ICD-10-CM

## 2012-03-10 DIAGNOSIS — N938 Other specified abnormal uterine and vaginal bleeding: Secondary | ICD-10-CM | POA: Insufficient documentation

## 2012-03-10 HISTORY — DX: Other specified health status: Z78.9

## 2012-03-10 LAB — CBC
HCT: 35.3 % — ABNORMAL LOW (ref 36.0–46.0)
MCHC: 34 g/dL (ref 30.0–36.0)
MCV: 89.6 fL (ref 78.0–100.0)
RDW: 12.7 % (ref 11.5–15.5)
WBC: 3.5 10*3/uL — ABNORMAL LOW (ref 4.0–10.5)

## 2012-03-10 LAB — POCT URINALYSIS DIP (DEVICE)
Glucose, UA: 250 mg/dL — AB
Ketones, ur: 40 mg/dL — AB
Specific Gravity, Urine: 1.01 (ref 1.005–1.030)
Urobilinogen, UA: >=8 mg/dL (ref 0.0–1.0)

## 2012-03-10 LAB — WET PREP, GENITAL: Yeast Wet Prep HPF POC: NONE SEEN

## 2012-03-10 MED ORDER — ONDANSETRON 4 MG PO TBDP
8.0000 mg | ORAL_TABLET | Freq: Once | ORAL | Status: AC
  Administered 2012-03-10: 8 mg via ORAL

## 2012-03-10 MED ORDER — KETOROLAC TROMETHAMINE 60 MG/2ML IM SOLN
60.0000 mg | Freq: Once | INTRAMUSCULAR | Status: AC
  Administered 2012-03-10: 60 mg via INTRAMUSCULAR

## 2012-03-10 MED ORDER — MEDROXYPROGESTERONE ACETATE 10 MG PO TABS: 10.0000 mg | ORAL_TABLET | Freq: Every day | ORAL | Status: DC

## 2012-03-10 MED ORDER — ONDANSETRON 4 MG PO TBDP
ORAL_TABLET | ORAL | Status: AC
  Filled 2012-03-10: qty 2

## 2012-03-10 MED ORDER — HYDROCODONE-ACETAMINOPHEN 5-500 MG PO TABS: 1.0000 | ORAL_TABLET | Freq: Four times a day (QID) | ORAL | Status: AC | PRN

## 2012-03-10 MED ORDER — CEPHALEXIN 500 MG PO CAPS: 500.0000 mg | ORAL_CAPSULE | Freq: Four times a day (QID) | ORAL | Status: AC

## 2012-03-10 MED ORDER — KETOROLAC TROMETHAMINE 60 MG/2ML IM SOLN
INTRAMUSCULAR | Status: AC
  Filled 2012-03-10: qty 2

## 2012-03-10 NOTE — MAU Provider Note (Signed)
History   Pt presents today c/o heavy vag bleeding and severe lower abd cramping. She is 40yo and states she has had very irregular vag bleeding since 01/2012. She states she has had at least 4-5 sporadic episode of bleeding sine 01/2012 with her last episode ending on 02/29/12. However, she states the bleeding began extremely heavily around 4am this morning and she has had severe cramping. She was seen earlier at [facility] and was sent to the MAU for further evaluation. She denies fever, dysuria, vag dc, or any other sx at this time.  CSN: [id number]  Arrival date and time: 03/10/12 1439   First Provider Initiated Contact with Patient 03/10/12 1518      Chief Complaint  Patient presents with  . Vaginal Bleeding   HPI  OB History    Grav Para Term Preterm Abortions TAB SAB Ect Mult Living   4 4 4  0 0 0 0 0 0 4      Past Medical History  Diagnosis Date  . No pertinent past medical history     Past Surgical History  Procedure Date  . Shoulder surgery 06/2011    right rotator cuff  . Dilation and curettage of uterus 01/2011    Family History  Problem Relation Age of Onset  . Anesthesia problems Neg Hx     History  Substance Use Topics  . Smoking status: Never Smoker   . Smokeless tobacco: Not on file  . Alcohol Use: No    Allergies:  Allergies  Allergen Reactions  . Eggs Or Egg-Derived Products Hives  . Onion Hives  . Tomato Hives    No prescriptions prior to admission    Review of Systems  Constitutional: Negative for fever and chills.  Eyes: Negative for blurred vision and double vision.  Cardiovascular: Negative for chest pain and palpitations.  Gastrointestinal: Positive for abdominal pain. Negative for nausea, vomiting, diarrhea and constipation.  Genitourinary: Negative for dysuria, urgency, frequency and hematuria.  Neurological: Negative for dizziness and headaches.  Psychiatric/Behavioral: Negative for depression and suicidal ideas.    Physical Exam   Blood pressure 98/70, pulse 68, temperature 98.3 F (36.8 C), temperature source Oral, resp. rate 18, height 5\' 3"  (1.6 m), weight 165 lb (74.844 kg), last menstrual period 02/16/2012.  Physical Exam  Nursing note and vitals reviewed. Constitutional: She is oriented to person, place, and time. She appears well-developed and well-nourished. No distress.  HENT:  Head: Normocephalic and atraumatic.  Eyes: EOM are normal. Pupils are equal, round, and reactive to light.  GI: Soft. She exhibits no distension and no mass. There is tenderness (Tenderness to lower quadrants bilaterally. No rebound. No guarding.). There is no rebound and no guarding.  Genitourinary: There is bleeding around the vagina. No vaginal discharge found.       Moderate amount of vag bleeding noted on exam. Cervix lg/closed. Large amount of stool can be felt in the rectum. Uterus palpates top-normal size. No adnexal masses noted. Pt with minimal tenderness on exam.  Neurological: She is alert and oriented to person, place, and time.  Skin: Skin is warm and dry. She is not diaphoretic.  Psychiatric: She has a normal mood and affect. Her behavior is normal. Judgment and thought content normal.    MAU Course  Procedures  Wet prep done. GC/Chlamydia done at urgent care.  Results for orders placed during the hospital encounter of 03/10/12 (from the past 72 hour(s))  CBC     Status: Abnormal  Collection Time   03/10/12  2:54 PM      Component Value Range Comment   WBC 3.5 (*) 4.0 - 10.5 (K/uL)    RBC 3.94  3.87 - 5.11 (MIL/uL)    Hemoglobin 12.0  12.0 - 15.0 (g/dL)    HCT 16.1 (*) 09.6 - 46.0 (%)    MCV 89.6  78.0 - 100.0 (fL)    MCH 30.5  26.0 - 34.0 (pg)    MCHC 34.0  30.0 - 36.0 (g/dL)    RDW 04.5  40.9 - 81.1 (%)    Platelets 213  150 - 400 (K/uL)   WET PREP, GENITAL     Status: Abnormal   Collection Time   03/10/12  3:25 PM      Component Value Range Comment   Yeast Wet Prep HPF POC NONE SEEN   NONE SEEN     Trich, Wet Prep NONE SEEN  NONE SEEN     Clue Cells Wet Prep HPF POC NONE SEEN  NONE SEEN     WBC, Wet Prep HPF POC FEW (*) NONE SEEN  FEW BACTERIA SEEN   US Transvaginal Non-ob  03/10/2012  *RADIOLOGY REPORT*  Clinical Data: Pain and abnormal bleeding.  G4 P4.  LMP 02/19/2012.  TRANSABDOMINAL AND TRANSVAGINAL ULTRASOUND OF PELVIS Technique:  Both transabdominal and transvaginal ultrasound examinations of the pelvis were performed. Transabdominal technique was performed for global imaging of the pelvis including uterus, ovaries, adnexal regions, and pelvic cul-de-sac.  Comparison: Pelvic ultrasound 01/01/2011   It was necessary to proceed with endovaginal exam following the transabdominal exam to visualize the endometrium.  Findings:  Uterus: Measures 9.0 x 5.2 x 5.4 cm.  Heterogeneous echotexture with linear areas of shadowing.  Focal intramural right fundal fibroid measures 2.2 x 1.5 x 1.5 cm.  Endometrium: Measures 6 mm.  Right ovary:  Normal appearance/no adnexal mass.  Measures 2.4 x 1.5 x 1.4 cm.  Left ovary: Normal appearance/no adnexal mass.  Measures 3.0 x 1.3 x 1.7 cm.  Other findings: No free fluid  IMPRESSION:  1.  2.2 cm intramural right fundal fibroid. Heterogeneous myometrium suggests uterine adenomyosis. 2.  Normal ovaries.  Original Report Authenticated By: Britta Mccreedy, M.D.   US Pelvis Complete  03/10/2012  *RADIOLOGY REPORT*  Clinical Data: Pain and abnormal bleeding.  G4 P4.  LMP 02/19/2012.  TRANSABDOMINAL AND TRANSVAGINAL ULTRASOUND OF PELVIS Technique:  Both transabdominal and transvaginal ultrasound examinations of the pelvis were performed. Transabdominal technique was performed for global imaging of the pelvis including uterus, ovaries, adnexal regions, and pelvic cul-de-sac.  Comparison: Pelvic ultrasound 01/01/2011   It was necessary to proceed with endovaginal exam following the transabdominal exam to visualize the endometrium.  Findings:  Uterus: Measures 9.0 x  5.2 x 5.4 cm.  Heterogeneous echotexture with linear areas of shadowing.  Focal intramural right fundal fibroid measures 2.2 x 1.5 x 1.5 cm.  Endometrium: Measures 6 mm.  Right ovary:  Normal appearance/no adnexal mass.  Measures 2.4 x 1.5 x 1.4 cm.  Left ovary: Normal appearance/no adnexal mass.  Measures 3.0 x 1.3 x 1.7 cm.  Other findings: No free fluid  IMPRESSION:  1.  2.2 cm intramural right fundal fibroid. Heterogeneous myometrium suggests uterine adenomyosis. 2.  Normal ovaries.  Original Report Authenticated By: Britta Mccreedy, M.D.     Assessment and Plan  DUB/adenomyosis/fibroid: discussed with pt at length. Will tx with provera 10mg  daily for 10 days. Will also give Rx for vicodin for prn pain. She will  f/u with Dr. Gaynell Face ASAP.  UTI: will await urine culture and give Rx for keflex. Discussed diet, activity, risks, and precautions.  Clinton Gallant. Reagen Haberman III, DrHSc, MPAS, PA-C  03/10/2012, 3:24 PM

## 2012-03-10 NOTE — ED Notes (Signed)
Charge  Nurse  Amada Jupiter  Given heads Up  Call  For pts  Pending arrival

## 2012-03-10 NOTE — ED Notes (Signed)
PT  REPORTS  SYMPTOMS  OF  LOW  BACK / ABD   PAIN WITH  VAGINAL BLEEDING  WHICH STARTED        400  AM       PT  AMBULATES  WITH  A  SLOW  GAIT         WALKS  BENT  OVER          SHE  IS  AWAKE  AS  WELL  AS  ALERT AND  ORIENTED

## 2012-03-10 NOTE — Discharge Instructions (Signed)
We have determined that your problem requires further evaluation in the emergency department.  We will take care of your transport there.  Once at the emergency department, you will be evaluated by a provider and they will order whatever treatment or tests they deem necessary.  We cannot guarantee that they will do any specific test or do any specific treatment.  ° °

## 2012-03-10 NOTE — Discharge Instructions (Signed)
Abnormal Uterine Bleeding Abnormal uterine bleeding can have many causes. Some cases are simply treated, while others are more serious. There are several kinds of bleeding that is considered abnormal, including:  Bleeding between periods.   Bleeding after sexual intercourse.   Spotting anytime in the menstrual cycle.   Bleeding heavier or more than normal.   Bleeding after menopause.  CAUSES  There are many causes of abnormal uterine bleeding. It can be present in teenagers, pregnant women, women during their reproductive years, and women who have reached menopause. Your caregiver will look for the more common causes depending on your age, signs, symptoms and your particular circumstance. Most cases are not serious and can be treated. Even the more serious causes, like cancer of the female organs, can be treated adequately if found in the early stages. That is why all types of bleeding should be evaluated and treated as soon as possible. DIAGNOSIS  Diagnosing the cause may take several kinds of tests. Your caregiver may:  Take a complete history of the type of bleeding.   Perform a complete physical exam and Pap smear.   Take an ultrasound on the abdomen showing a picture of the female organs and the pelvis.   Inject dye into the uterus and Fallopian tubes and X-ray them (hysterosalpingogram).   Place fluid in the uterus and do an ultrasound (sonohysterogrqphy).   Take a CT scan to examine the female organs and pelvis.   Take an MRI to examine the female organs and pelvis. There is no X-ray involved with this procedure.   Look inside the uterus with a telescope that has a light at the end (hysteroscopy).   Scrap the inside of the uterus to get tissue to examine (Dilatation and Curettage, D&C).   Look into the pelvis with a telescope that has a light at the end (laparoscopy). This is done through a very small cut (incision) in the abdomen.  TREATMENT  Treatment will depend on the  cause of the abnormal bleeding. It can include:  Doing nothing to allow the problem to take care of itself over time.   Hormone treatment.   Birth control pills.   Treating the medical condition causing the problem.   Laparoscopy.   Major or minor surgery   Destroying the lining of the uterus with electrical currant, laser, freezing or heat (uterine ablation).  HOME CARE INSTRUCTIONS   Follow your caregiver's recommendation on how to treat your problem.   See your caregiver if you missed a menstrual period and think you may be pregnant.   If you are bleeding heavily, count the number of pads/tampons you use and how often you have to change them. Tell this to your caregiver.   Avoid sexual intercourse until the problem is controlled.  SEEK MEDICAL CARE IF:   You have any kind of abnormal bleeding mentioned above.   You feel dizzy at times.   You are 41 years old and have not had a menstrual period yet.  SEEK IMMEDIATE MEDICAL CARE IF:   You pass out.   You are changing pads/tampons every 15 to 30 minutes.   You have belly (abdominal) pain.   You have a temperature of 100 F (37.8 C) or higher.   You become sweaty or weak.   You are passing large blood clots from the vagina.   You start to feel sick to your stomach (nauseous) and throw up (vomit).  Document Released: 12/08/2005 Document Revised: 11/27/2011 Document Reviewed: 05/03/2009 [employer]   Patient Information 2012 [place], [place].Fibroids You have been diagnosed as having a fibroid. Fibroids are smooth muscle lumps (tumors) which can occur any place in a woman's body. They are usually in the womb (uterus). The most common problem (symptom) of fibroids is bleeding. Over time this may cause low red blood cells (anemia). Other symptoms include feelings of pressure and pain in the pelvis. The diagnosis (learning what is wrong) of fibroids is made by physical exam. Sometimes tests such as an ultrasound are used. This is  helpful when fibroids are felt around the ovaries and to look for tumors. TREATMENT   Most fibroids do not need surgical or medical treatment. Sometimes a tissue sample (biopsy) of the lining of the uterus is done to rule out cancer. If there is no cancer and only a small amount of bleeding, the problem can be watched.   Hormonal treatment can improve the problem.   When surgery is needed, it can consist of removing the fibroid. Vaginal birth may not be possible after the removal of fibroids. This depends on where they are and the extent of surgery. When pregnancy occurs with fibroids it is usually normal.   Your caregiver can help decide which treatments are best for you.  HOME CARE INSTRUCTIONS   Do not use aspirin as this may increase bleeding problems.   If your periods (menses) are heavy, record the number of pads or tampons used per month. Bring this information to your caregiver. This can help them determine the best treatment for you.  SEEK IMMEDIATE MEDICAL CARE IF:  You have pelvic pain or cramps not controlled with medications, or experience a sudden increase in pain.   You have an increase of pelvic bleeding between and during menses.   You feel lightheaded or have fainting spells.   You develop worsening belly (abdominal) pain.  Document Released: 12/05/2000 Document Revised: 11/27/2011 Document Reviewed: 07/27/2008 [facility] Patient Information 2012 [place], [place].Urinary Tract Infection Infections of the urinary tract can start in several places. A bladder infection (cystitis), a kidney infection (pyelonephritis), and a prostate infection (prostatitis) are different types of urinary tract infections (UTIs). They usually get better if treated with medicines (antibiotics) that kill germs. Take all the medicine until it is gone. You or your child may feel better in a few days, but TAKE ALL MEDICINE or the infection may not respond and may become more difficult to treat. HOME  CARE INSTRUCTIONS   Drink enough water and fluids to keep the urine clear or pale yellow. Cranberry juice is especially recommended, in addition to large amounts of water.   Avoid caffeine, tea, and carbonated beverages. They tend to irritate the bladder.   Alcohol may irritate the prostate.   Only take over-the-counter or prescription medicines for pain, discomfort, or fever as directed by your caregiver.  To prevent further infections:  Empty the bladder often. Avoid holding urine for long periods of time.   After a bowel movement, women should cleanse from front to back. Use each tissue only once.   Empty the bladder before and after sexual intercourse.  FINDING OUT THE RESULTS OF YOUR TEST Not all test results are available during your visit. If your or your child's test results are not back during the visit, make an appointment with your caregiver to find out the results. Do not assume everything is normal if you have not heard from your caregiver or the medical facility. It is important for you to follow up on all test  results. SEEK MEDICAL CARE IF:   There is back pain.   Your baby is older than 3 months with a rectal temperature of 100.5 F (38.1 C) or higher for more than 1 day.   Your or your child's problems (symptoms) are no better in 3 days. Return sooner if you or your child is getting worse.  SEEK IMMEDIATE MEDICAL CARE IF:   There is severe back pain or lower abdominal pain.   You or your child develops chills.   You have a fever.   Your baby is older than 3 months with a rectal temperature of 102 F (38.9 C) or higher.   Your baby is 32 months old or younger with a rectal temperature of 100.4 F (38 C) or higher.   There is nausea or vomiting.   There is continued burning or discomfort with urination.  MAKE SURE YOU:   Understand these instructions.   Will watch your condition.   Will get help right away if you are not doing well or get worse.    Document Released: 09/17/2005 Document Revised: 11/27/2011 Document Reviewed: 04/22/2007 [facility] Patient Information 2012 [place], [place].

## 2012-03-10 NOTE — MAU Note (Signed)
Sent from [facility] for further eval of lower abd pain/back pain. Has had cx's completed. Pain began at 0400 this am. Denies abnormal vaginal d/c changes. Has had irregular bleeding, bled 02/16/2012-02/27/2012, spotting 02/28/2012-02/29/2012, now bleeding again. Denies burning with voiding, does note urgency.

## 2012-03-10 NOTE — ED Provider Notes (Signed)
Chief Complaint  Patient presents with  . Vaginal Bleeding    History of Present Illness:   Toni Chang is a 41 year old female who was awakened at 4 AM today with severe, bilateral pelvic pain radiating to her back and to both hips. This would come and go and was dull and stabbing at the same time. It is rated as a 10 over 10 in intensity. Along with this she had heavy vaginal bleeding, heavier than normal menses. She is passing clots but no tissue. She feels chilled but does not have a fever. She denies any nausea or vomiting. She's had no constipation or diarrhea. She denies any urinary symptoms. The patient states that she had vaginal bleeding from February 24 through March 8 which was lighter than normal menses, followed by some spotting from March 9 and 10th, and then no bleeding or pain until today. She is sexually active and does not use any birth control. She is married and is in a mutually monogamous relationship. She was seen by Dr. Gaynell Face about a year ago for some similar symptoms with pain and bleeding. A D&C and hysteroscopy was done and she was diagnosed with endometriosis.  Review of Systems:  Other than noted above, the patient denies any of the following symptoms: Systemic:  No fever, chills, sweats, fatigue, or weight loss. GI:  No abdominal pain, nausea, anorexia, vomiting, diarrhea, constipation, melena or hematochezia. GU:  No dysuria, frequency, urgency, hematuria, vaginal discharge, itching, or abnormal vaginal bleeding. Skin:  No rash or itching.   PMFSH:  Past medical history, family history, social history, meds, and allergies were reviewed.  Physical Exam:   Vital signs:  BP 105/75  Pulse 66  Temp(Src) 98.4 F (36.9 C) (Oral)  Resp 18  SpO2 98%  LMP 02/16/2012 General:  Alert, oriented and in no distress. Lungs:  Breath sounds clear and equal bilaterally.  No wheezes, rales or rhonchi. Heart:  Regular rhythm.  No gallops or murmers. Abdomen:  Soft, flat and  non-distended.  No organomegaly or mass.  She has right lower quadrant and, suprapubic, and left lower quadrant tenderness to palpation without guarding or rebound.  Bowel sounds normally active. Pelvic exam:  External genitalia unremarkable, there was a large amount of blood with clots in the vaginal vault. The cervix appeared normal. There was moderate pain on cervical motion. Uterus was normal in size and moderately tender. She has moderate bilateral adnexal tenderness but no mass. There was an unusual, indurated feeling to the posterior lip of the cervix in the cul-de-sac. Skin:  Clear, warm and dry.  Labs:   Results for orders placed during the hospital encounter of 03/10/12  POCT URINALYSIS DIP (DEVICE)      Component Value Range   Glucose, UA 250 (*) NEGATIVE (mg/dL)   Bilirubin Urine LARGE (*) NEGATIVE    Ketones, ur 40 (*) NEGATIVE (mg/dL)   Specific Gravity, Urine 1.010  1.005 - 1.030    Hgb urine dipstick LARGE (*) NEGATIVE    pH 8.5 (*) 5.0 - 8.0    Protein, ur >=300 (*) NEGATIVE (mg/dL)   Urobilinogen, UA >=1.6  0.0 - 1.0 (mg/dL)   Nitrite POSITIVE (*) NEGATIVE    Leukocytes, UA LARGE (*) NEGATIVE   POCT PREGNANCY, URINE      Component Value Range   Preg Test, Ur NEGATIVE  NEGATIVE      Course in Urgent Care Center:   She was given Toradol 60 mg IM and tolerated this well without  any immediate side effects.  Medical Decision Making:  Differential diagnosis considerations include pelvic inflammatory disease, intrauterine pregnancy, ectopic pregnancy, ovarian cyst with rupture, or torsion of ovarian cyst. I think she needs further evaluation at [facility] and ultrasound.   Assessment:   Diagnoses that have been ruled out:  None  Diagnoses that are still under consideration:  None  Final diagnoses:  Pelvic inflammatory disease    Plan:   1.  The patient was taken by shuttle to the [facility] emergency room for further evaluation. I called ahead and spoke to  the nurse midwife, prior to her discharge.  Reuben Likes, MD 03/10/12 [phone]

## 2012-03-11 LAB — URINE CULTURE: Culture  Setup Time: 201303210014

## 2012-11-25 ENCOUNTER — Emergency Department (INDEPENDENT_AMBULATORY_CARE_PROVIDER_SITE_OTHER): Payer: Medicaid Other

## 2012-11-25 ENCOUNTER — Encounter (HOSPITAL_COMMUNITY): Payer: Self-pay | Admitting: *Deleted

## 2012-11-25 ENCOUNTER — Emergency Department (HOSPITAL_COMMUNITY)
Admission: EM | Admit: 2012-11-25 | Discharge: 2012-11-25 | Disposition: A | Discharge status: Home / Self Care | Payer: Medicaid Other | Source: Home / Self Care | Attending: Emergency Medicine | Admitting: Emergency Medicine

## 2012-11-25 DIAGNOSIS — M2550 Pain in unspecified joint: Secondary | ICD-10-CM

## 2012-11-25 MED ORDER — MELOXICAM 15 MG PO TABS: 15.0000 mg | ORAL_TABLET | Freq: Every day | ORAL | Status: DC

## 2012-11-25 NOTE — ED Provider Notes (Signed)
Chief Complaint  Patient presents with  . Hand Pain    History of Present Illness:   Toni Chang is a 41 year old female who presents today with a three-day history of pain overlying the MCP joint of the left index finger. She denies any injury. She feels it's a little bit swollen stiff. She denies any pain of any other joints in her hand or elsewhere. She's never had anything like this before. She denies any history of arthritis, gout, or rheumatoid arthritis. Her only other symptoms of feeling somewhat tired and achy all over. She denies any fever or chills.  Review of Systems:  Other than noted above, the patient denies any of the following symptoms: Systemic:  No fevers, chills, sweats, or aches.  No fatigue or tiredness. Musculoskeletal:  No joint pain, arthritis, bursitis, swelling, back pain, or neck pain. Neurological:  No muscular weakness, paresthesias, headache, or trouble with speech or coordination.  No dizziness.  PMFSH:  Past medical history, family history, social history, meds, and allergies were reviewed.  Physical Exam:   Vital signs:  BP 103/72  Pulse 76  Temp 98.4 F (36.9 C) (Oral)  Resp 18  SpO2 99%  LMP 11/17/2012 Gen:  Alert and oriented times 3.  In no distress. Musculoskeletal: There is no obvious swelling over the MCP joint of the left index finger. She does have mild pain to palpation. The joint has a full range of motion with pain. Otherwise, all other joints had a full a ROM with no swelling, bruising or deformity.  No edema, pulses full. Extremities were warm and pink.  Capillary refill was brisk.  Skin:  Clear, warm and dry.  No rash. Neuro:  Alert and oriented times 3.  Muscle strength was normal.  Sensation was intact to light touch.   Radiology:  Dg Hand Complete Left  11/25/2012  *RADIOLOGY REPORT*  Clinical Data: Pain  LEFT HAND - COMPLETE 3+ VIEW  Comparison: None.  Findings:  Frontal, oblique, and lateral views were obtained.  No fracture or dislocation.   Joint spaces appear intact.  No erosive change.  IMPRESSION: No abnormality noted.   Original Report Authenticated By: Bretta Bang, M.D.    I reviewed the images independently and personally and concur with the radiologist's findings.  Assessment:  The encounter diagnosis was Arthralgia.  Plan:   1.  The following meds were prescribed:   New Prescriptions   MELOXICAM (MOBIC) 15 MG TABLET    Take 1 tablet (15 mg total) by mouth daily.   2.  The patient was instructed in symptomatic care, including rest and activity, elevation, application of ice and compression.  Appropriate handouts were given. 3.  The patient was told to return if becoming worse in any way, if no better in 3 or 4 days, and given some red flag symptoms that would indicate earlier return.   4.  The patient was told to follow up with Dr. Amanda Pea if no better in 2 weeks.    Reuben Likes, MD 11/25/12 [phone]

## 2012-11-25 NOTE — ED Notes (Signed)
Pt  Has  Pain  l  Index  Finger  X    3   Days         denys  Any  Injury              Does  Not  Bite         Fingernails     rom is  Present  Pain on  Movement

## 2013-05-09 ENCOUNTER — Emergency Department (HOSPITAL_COMMUNITY)
Admission: EM | Admit: 2013-05-09 | Discharge: 2013-05-09 | Disposition: A | Discharge status: Home / Self Care | Payer: Self-pay | Attending: Emergency Medicine | Admitting: Emergency Medicine

## 2013-05-09 ENCOUNTER — Encounter (HOSPITAL_COMMUNITY): Payer: Self-pay | Admitting: Emergency Medicine

## 2013-05-09 ENCOUNTER — Emergency Department (HOSPITAL_COMMUNITY): Payer: Self-pay

## 2013-05-09 DIAGNOSIS — M7918 Myalgia, other site: Secondary | ICD-10-CM

## 2013-05-09 DIAGNOSIS — M479 Spondylosis, unspecified: Secondary | ICD-10-CM

## 2013-05-09 DIAGNOSIS — M542 Cervicalgia: Secondary | ICD-10-CM

## 2013-05-09 DIAGNOSIS — R0789 Other chest pain: Secondary | ICD-10-CM | POA: Insufficient documentation

## 2013-05-09 MED ORDER — CYCLOBENZAPRINE HCL 10 MG PO TABS: 10.0000 mg | ORAL_TABLET | Freq: Two times a day (BID) | ORAL | Status: DC | PRN

## 2013-05-09 MED ORDER — IBUPROFEN 800 MG PO TABS
800.0000 mg | ORAL_TABLET | Freq: Once | ORAL | Status: AC
  Administered 2013-05-09: 800 mg via ORAL
  Filled 2013-05-09: qty 1

## 2013-05-09 NOTE — ED Notes (Signed)
Pt sts waking up this AM with left sided neck pain that radiates to left shoulder. sts it worsens when pt turns to right side- sts pain shoots down shoulder. Pt has limited ROM due to pain. Sensation in tact. Some tenderness with palpation. Pt alert and oriented.

## 2013-05-09 NOTE — ED Provider Notes (Signed)
History     CSN: [id number]  Arrival date & time 05/09/13  1059   First MD Initiated Contact with Patient 05/09/13 1153      Chief Complaint  Patient presents with  . Neck Pain    (Consider location/radiation/quality/duration/timing/severity/associated sxs/prior treatment) HPI Comments: Toni Chang is a 42 y/o F presenting to the ED with left sided neck pain that she woke up with this morning. Stated that the pain is localized to the left side with radiation to the left shoulder - denied radiation to the left arm. Stated that when she is at rest the pain is described as a constant dull, aching sensation. Patient stated that pain worsens with coughing, laughing, jerking motions, and any motion - stated that the neck pain worsens when she lifts her left arm. Stated that when she has motion the pain is more of a stabbing sensation. Patient reported that she used BC powder this morning around 6:00Am with a heating pad - stated that there was no relief. Patient stated that she was completely fine yesterday. Denied injury, trauma. 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 Denied weakness, numbness, tingling, paresthesias, chest pain, shortness of breathe, difficulty breathing, abdominal pain, drop arm, neurological deficits, gi symptoms, urinary symptoms.   The history is provided by the patient. No language interpreter was used.    Past Medical History  Diagnosis Date  . No pertinent past medical history     Past Surgical History  Procedure Laterality Date  . Shoulder surgery  06/2011    right rotator cuff  . Dilation and curettage of uterus  01/2011    Family History  Problem Relation Age of Onset  . Anesthesia problems Neg Hx     History  Substance Use Topics  . Smoking status: Never Smoker   . Smokeless tobacco: Not on file  . Alcohol Use: No    OB History   Grav Para Term Preterm Abortions TAB SAB Ect Mult Living   4 4  4  0 0 0 0 0 0 4      Review of Systems  Constitutional: Negative for fever, chills and fatigue.  HENT: Positive for neck pain. Negative for ear pain, congestion, sore throat, sneezing, trouble swallowing, neck stiffness, voice change and tinnitus.   Eyes: Negative for photophobia, pain and visual disturbance.  Respiratory: Negative for cough, chest tightness, shortness of breath and wheezing.   Cardiovascular: Negative for chest pain.  Gastrointestinal: Negative for nausea, vomiting, abdominal pain, diarrhea, constipation and blood in stool.  Genitourinary: Negative for dysuria, urgency, hematuria, decreased urine volume and difficulty urinating.  Neurological: Negative for dizziness, weakness, light-headedness, numbness and headaches.  All other systems reviewed and are negative.    Allergies  Eggs or egg-derived products; Onion; and Tomato  Home Medications   Current Outpatient Rx  Name  Route  Sig  Dispense  Refill  . cyclobenzaprine (FLEXERIL) 10 MG tablet   Oral   Take 1 tablet (10 mg total) by mouth 2 (two) times daily as needed for muscle spasms.   20 tablet   0     BP 109/69  Pulse 61  Temp(Src) 98.2 F (36.8 C) (Oral)  Resp 16  SpO2 100%  LMP 04/15/2013  Physical Exam  Nursing note and vitals reviewed. Constitutional: She is oriented to person, place, and time. She appears well-developed and well-nourished. No distress.  HENT:  Head: Normocephalic and atraumatic.  Mouth/Throat: Oropharynx  is clear and moist. No oropharyngeal exudate.  Uvula midline, symmetrical elevation  Eyes: Conjunctivae and EOM are normal. Pupils are equal, round, and reactive to light. Right eye exhibits no discharge. Left eye exhibits no discharge.  EOMs intact without pain  Neck: Neck supple. Muscular tenderness present. No spinous process tenderness present. No rigidity. Decreased range of motion present. No tracheal deviation, no edema and no erythema present. No thyromegaly present.     Negative erythema, swelling, inflammation, goiter, masses, lesions, wounds noted to the neck Negative tracheal deviation Tenderness upon palpation to the left side of the neck to the acromion of the left shoulder - discomfort upon palpation to the left side of the upper trapezius muscle Limited ROM to neck secondary to pain Negative neck stiffness and nuchal rigidity noted Negative cervical spine tenderness    Cardiovascular: Normal rate, regular rhythm and normal heart sounds.  Exam reveals no friction rub.   No murmur heard. Radial pulses 2+ bilaterally Pedal pulses 2+ bilaterally Negative leg and ankle swelling Negative pitting edema noted  Pulmonary/Chest: Effort normal and breath sounds normal. No respiratory distress. She has no wheezes. She has no rales. She exhibits no tenderness.  Musculoskeletal:  Negative mid-spinal tenderness to cervical, thoracic, lumbar, and sacral spine  Limited ROM to the shoulder secondary to neck pain Full ROM to left arm, elbow, and hand Full ROM to the lower extremities bilaterally - mild limitation to the left knee secondary to pain referred to the neck.  Strength 5+/5+ to right upper extremities, 4+/5+ to left upper extremities secondary to neck pain Strength 5+/5+ to lower extremities bilaterally    Lymphadenopathy:    She has no cervical adenopathy.  Neurological: She is alert and oriented to person, place, and time. No cranial nerve deficit. She exhibits normal muscle tone. Coordination normal.  Cranial nerves III-XII grossly intact  Sensation intact to upper and lower extremities bilaterally with differentiation to sharp and dull touch  Gait steady and balanced   Skin: Skin is warm and dry. No rash noted. She is not diaphoretic. No erythema.  Psychiatric: She has a normal mood and affect. Her behavior is normal. Thought content normal.    ED Course  Procedures (including critical care time)   Date: 05/09/2013  Rate: 81  Rhythm:  normal sinus rhythm and sinus arrhythmia  QRS Axis: normal  Intervals: normal  ST/T Wave abnormalities: nonspecific T wave changes  Conduction Disutrbances:none  Narrative Interpretation:   Old EKG Reviewed: unchanged    Labs Reviewed - No data to display Ct Cervical Spine Wo Contrast  05/09/2013   *RADIOLOGY REPORT*  Clinical Data: Severe neck and left shoulder pain.  CT CERVICAL SPINE WITHOUT CONTRAST  Technique:  Multidetector CT imaging of the cervical spine was performed. Multiplanar CT image reconstructions were also generated.  Comparison: None.  Findings: Mild anterior spur formation at the C3-4 level.  Disc space narrowing, mild anterior spur formation and minimal posterior spur formation at the T2-3 level.  No posterior or uncinate spur formation.  No canal or foraminal stenosis.  No fractures or subluxations.  Unremarkable soft tissues.  IMPRESSION: Mild anterior spondylosis at the C3-4 level and mild anterior and minimal posterior spondylosis of the T2-3 level.  Otherwise, unremarkable examination.   Original Report Authenticated By: Beckie Salts, M.D.     1. Neck pain   2. Musculoskeletal pain   3. Spondylosis       MDM  Patient afebrile, normotensive, non-tachycardic, non-tachypenic, adequate saturation on room air,  alert and oriented.   Negative fever, chills, neck stiffness, cervical spinous tenderness, nuchal rigidity - less likely meningitis. Negative neurological deficits. Negative vascular deficits noted. Limited ROM to neck, left shoulder motion secondary to pain. Sensation intact to upper and lower extremities. Suspicious for musculoskeletal injury. CT scan of cervical neck ordered. Negative STEMI/NSTEMI, troponins negative - less likely acute MI, cardiac issue.  Discussed case with Dr. Denton Lank - agreed with CT scan.   CT scan of cervical spine - mild spondylosis noted, disc space narrowing noted - negative acute findings. Discussed findings with Dr. Denton Lank - cleared  for discharge. Patient denied chest pain. Patient aseptic, non-toxic appearing, in no acute distress. Patient discharged. Neck pain suspected to be due to musculoskeletal purposes with unknown etiology. Discharged patient with flexeril - discussed course and restrictions/precuations. Discussed with patient to rest, stay hydrated. Discussed with patient to refrain from strenuous activity, no heavy lifting. Referred patient to [facility], orthopedics, and neurologists for follow-up. Recommended patient to use icy-hot ointment and patches for relief. Discussed with patient to monitor symptoms and if symptoms are to worsen or change to report back to the ED. Patient agreed to plan of care, understood, all questions answered.         Raymon Mutton, PA-C 05/09/13 2116

## 2013-05-09 NOTE — ED Notes (Signed)
Woke up w/ left sided neck pain and left shoulder pain  Took a goodie powder and used  Did not help, pad started this am no injury no n/v/sob no numbness or tingling

## 2013-05-10 NOTE — ED Provider Notes (Signed)
Medical screening examination/treatment/procedure(s) were conducted as a shared visit with non-physician practitioner(s) and myself.  I personally evaluated the patient during the encounter Pt states awoke w left lateral neck pain. Constant, dull. Worse w palpation, turning neck side to side, twisting neck. No injury. ?slept wrong. No radicular pain or numbness/weakness. No cp or sob. No fever or chills. No throat pain or trouble breathing or swallowing. Spine nt. No sts. No skin changes or erythema.   Suzi Roots, MD 05/10/13 [phone]

## 2013-05-25 ENCOUNTER — Ambulatory Visit: Payer: Self-pay | Admitting: Neurology

## 2013-10-07 ENCOUNTER — Emergency Department (HOSPITAL_COMMUNITY)
Admission: EM | Admit: 2013-10-07 | Discharge: 2013-10-07 | Disposition: A | Discharge status: Home / Self Care | Payer: Self-pay | Attending: Emergency Medicine | Admitting: Emergency Medicine

## 2013-10-07 ENCOUNTER — Encounter (HOSPITAL_COMMUNITY): Payer: Self-pay | Admitting: Emergency Medicine

## 2013-10-07 DIAGNOSIS — M79609 Pain in unspecified limb: Secondary | ICD-10-CM

## 2013-10-07 DIAGNOSIS — Y929 Unspecified place or not applicable: Secondary | ICD-10-CM | POA: Insufficient documentation

## 2013-10-07 DIAGNOSIS — X58XXXA Exposure to other specified factors, initial encounter: Secondary | ICD-10-CM | POA: Insufficient documentation

## 2013-10-07 DIAGNOSIS — S8010XA Contusion of unspecified lower leg, initial encounter: Secondary | ICD-10-CM | POA: Insufficient documentation

## 2013-10-07 DIAGNOSIS — T148XXA Other injury of unspecified body region, initial encounter: Secondary | ICD-10-CM

## 2013-10-07 DIAGNOSIS — Y939 Activity, unspecified: Secondary | ICD-10-CM | POA: Insufficient documentation

## 2013-10-07 LAB — POCT I-STAT, CHEM 8
Creatinine, Ser: 1.1 mg/dL (ref 0.50–1.10)
Glucose, Bld: 87 mg/dL (ref 70–99)
Hemoglobin: 12.2 g/dL (ref 12.0–15.0)
Potassium: 4.1 mEq/L (ref 3.5–5.1)

## 2013-10-07 MED ORDER — IBUPROFEN 800 MG PO TABS: 800.0000 mg | ORAL_TABLET | Freq: Three times a day (TID) | ORAL | Status: DC

## 2013-10-07 NOTE — Progress Notes (Signed)
Left lower extremity venous duplex completed.  Left:  No evidence of DVT, superficial thrombosis, or Baker's cyst.  Right:  Negative for DVT in the common femoral vein.  

## 2013-10-07 NOTE — ED Notes (Signed)
Pt st's she had a cramp in left lower leg and has had pain in the lower leg since then.  No known injury

## 2013-10-07 NOTE — ED Provider Notes (Signed)
CSN: [id number]     Arrival date & time 10/07/13  1349 History  This chart was scribed for non-physician practitioner Teressa Lower, NP working with Lyanne Co, MD by Leone Payor, ED Scribe. This patient was seen in room TR09C/TR09C and the patient's care was started at 1349.    Chief Complaint  Patient presents with  . Leg Pain    The history is provided by the patient. No language interpreter was used.    HPI Comments: Toni Chang is a 42 y.o. female who presents to the Emergency Department complaining of intermittent left calf pain and bruising that began 1.5 weeks ago. She denies injury or trauma to the affected area. She states tightening of the calf muscle worsens the pain. She reports nothing alleviates the pain. She denies birth control use. She denies recent long distance traveling. She denies history of DVT or PE.    Past Medical History  Diagnosis Date  . No pertinent past medical history    Past Surgical History  Procedure Laterality Date  . Shoulder surgery  06/2011    right rotator cuff  . Dilation and curettage of uterus  01/2011   Family History  Problem Relation Age of Onset  . Anesthesia problems Neg Hx    History  Substance Use Topics  . Smoking status: Never Smoker   . Smokeless tobacco: Not on file  . Alcohol Use: No   OB History   Grav Para Term Preterm Abortions TAB SAB Ect Mult Living   4 4 4  0 0 0 0 0 0 4     Review of Systems  Musculoskeletal: Positive for myalgias (left calf pain with bruising ).  All other systems reviewed and are negative.    Allergies  Eggs or egg-derived products; Onion; and Tomato  Home Medications  No current outpatient prescriptions on file. BP 106/67  Pulse 92  Temp(Src) 98 F (36.7 C) (Oral)  Resp 16  SpO2 98%  LMP 09/06/2013 Physical Exam  Nursing note and vitals reviewed. Constitutional: She is oriented to person, place, and time. She appears well-developed and well-nourished.  HENT:   Head: Normocephalic and atraumatic.  Eyes: Conjunctivae and EOM are normal. Pupils are equal, round, and reactive to light.  Neck: Carotid bruit is not present.  Cardiovascular: Normal rate.   Pulmonary/Chest: Effort normal.  Abdominal: Soft. She exhibits no pulsatile midline mass.  Musculoskeletal:  3cm diameter bruise and a 2cm diameter bruise noted to left calf. No swelling, warmth, redness noted.   Neurological: She is alert and oriented to person, place, and time.  Skin: Skin is warm and dry.  Psychiatric: She has a normal mood and affect. Her behavior is normal.    ED Course  Procedures   DIAGNOSTIC STUDIES: Oxygen Saturation is 98% on RA, normal by my interpretation.    COORDINATION OF CARE: 4:04 PM Discussed treatment plan with pt at bedside and pt agreed to plan.   Labs Review Labs Reviewed - No data to display Imaging Review No results found.  EKG Interpretation   None       MDM   1. Contusion    No dvt noted:pt electrolytes NWG:NFAOZ bony abnormality as no known injury   I personally performed the services described in this documentation, which was scribed in my presence. The recorded information has been reviewed and is accurate.    Teressa Lower, NP 10/07/13 1717

## 2013-10-07 NOTE — ED Notes (Signed)
Pt reports she has been having intermmittent pain in her left thigh for the past week and a half. Reports that she did travel to [place] recently but denies any birth control. States no history of DVT or PE. Reports that she has noticed bruising to the area also. Denies any other complaints at this time.

## 2013-10-08 NOTE — ED Provider Notes (Signed)
Medical screening examination/treatment/procedure(s) were performed by non-physician practitioner and as supervising physician I was immediately available for consultation/collaboration.  Birgit Nowling M Catheline Hixon, MD 10/08/13 0015 

## 2014-01-08 ENCOUNTER — Emergency Department (HOSPITAL_COMMUNITY)
Admission: EM | Admit: 2014-01-08 | Discharge: 2014-01-08 | Disposition: A | Discharge status: Home / Self Care | Payer: Self-pay | Attending: Emergency Medicine | Admitting: Emergency Medicine

## 2014-01-08 ENCOUNTER — Encounter (HOSPITAL_COMMUNITY): Payer: Self-pay | Admitting: Emergency Medicine

## 2014-01-08 DIAGNOSIS — N390 Urinary tract infection, site not specified: Secondary | ICD-10-CM

## 2014-01-08 DIAGNOSIS — B9689 Other specified bacterial agents as the cause of diseases classified elsewhere: Secondary | ICD-10-CM

## 2014-01-08 DIAGNOSIS — N760 Acute vaginitis: Secondary | ICD-10-CM

## 2014-01-08 DIAGNOSIS — L049 Acute lymphadenitis, unspecified: Secondary | ICD-10-CM

## 2014-01-08 DIAGNOSIS — R109 Unspecified abdominal pain: Secondary | ICD-10-CM | POA: Insufficient documentation

## 2014-01-08 DIAGNOSIS — Z3202 Encounter for pregnancy test, result negative: Secondary | ICD-10-CM | POA: Insufficient documentation

## 2014-01-08 DIAGNOSIS — R599 Enlarged lymph nodes, unspecified: Secondary | ICD-10-CM | POA: Insufficient documentation

## 2014-01-08 LAB — CBC WITH DIFFERENTIAL/PLATELET
BASOS ABS: 0 10*3/uL (ref 0.0–0.1)
BASOS PCT: 1 % (ref 0–1)
EOS PCT: 1 % (ref 0–5)
Eosinophils Absolute: 0 10*3/uL (ref 0.0–0.7)
HEMATOCRIT: 33.5 % — AB (ref 36.0–46.0)
Hemoglobin: 11.5 g/dL — ABNORMAL LOW (ref 12.0–15.0)
Lymphocytes Relative: 26 % (ref 12–46)
Lymphs Abs: 1.7 10*3/uL (ref 0.7–4.0)
MCH: 31.2 pg (ref 26.0–34.0)
MCHC: 34.3 g/dL (ref 30.0–36.0)
MCV: 90.8 fL (ref 78.0–100.0)
MONO ABS: 0.6 10*3/uL (ref 0.1–1.0)
Monocytes Relative: 10 % (ref 3–12)
Neutro Abs: 4.1 10*3/uL (ref 1.7–7.7)
Neutrophils Relative %: 63 % (ref 43–77)
Platelets: 215 10*3/uL (ref 150–400)
RBC: 3.69 MIL/uL — ABNORMAL LOW (ref 3.87–5.11)
RDW: 12.3 % (ref 11.5–15.5)
WBC: 6.4 10*3/uL (ref 4.0–10.5)

## 2014-01-08 LAB — POCT I-STAT, CHEM 8
BUN: 7 mg/dL (ref 6–23)
Calcium, Ion: 1.21 mmol/L (ref 1.12–1.23)
Chloride: 100 mEq/L (ref 96–112)
Creatinine, Ser: 0.9 mg/dL (ref 0.50–1.10)
Glucose, Bld: 106 mg/dL — ABNORMAL HIGH (ref 70–99)
HEMATOCRIT: 35 % — AB (ref 36.0–46.0)
Hemoglobin: 11.9 g/dL — ABNORMAL LOW (ref 12.0–15.0)
Potassium: 4.1 mEq/L (ref 3.7–5.3)
SODIUM: 136 meq/L — AB (ref 137–147)
TCO2: 26 mmol/L (ref 0–100)

## 2014-01-08 LAB — URINALYSIS, ROUTINE W REFLEX MICROSCOPIC
Bilirubin Urine: NEGATIVE
Glucose, UA: NEGATIVE mg/dL
Hgb urine dipstick: NEGATIVE
Ketones, ur: NEGATIVE mg/dL
NITRITE: NEGATIVE
Protein, ur: NEGATIVE mg/dL
Specific Gravity, Urine: 1.03 (ref 1.005–1.030)
Urobilinogen, UA: 1 mg/dL (ref 0.0–1.0)
pH: 6.5 (ref 5.0–8.0)

## 2014-01-08 LAB — WET PREP, GENITAL
Trich, Wet Prep: NONE SEEN
Yeast Wet Prep HPF POC: NONE SEEN

## 2014-01-08 LAB — URINE MICROSCOPIC-ADD ON

## 2014-01-08 LAB — POCT PREGNANCY, URINE: PREG TEST UR: NEGATIVE

## 2014-01-08 MED ORDER — OXYCODONE-ACETAMINOPHEN 5-325 MG PO TABS
1.0000 | ORAL_TABLET | Freq: Once | ORAL | Status: AC
  Administered 2014-01-08: 1 via ORAL
  Filled 2014-01-08: qty 1

## 2014-01-08 MED ORDER — CEPHALEXIN 250 MG PO CAPS
500.0000 mg | ORAL_CAPSULE | Freq: Once | ORAL | Status: AC
  Administered 2014-01-08: 500 mg via ORAL
  Filled 2014-01-08: qty 2

## 2014-01-08 MED ORDER — METRONIDAZOLE 500 MG PO TABS
500.0000 mg | ORAL_TABLET | Freq: Once | ORAL | Status: AC
  Administered 2014-01-08: 500 mg via ORAL
  Filled 2014-01-08: qty 1

## 2014-01-08 MED ORDER — METRONIDAZOLE 500 MG PO TABS: 500.0000 mg | ORAL_TABLET | Freq: Two times a day (BID) | ORAL | Status: DC

## 2014-01-08 MED ORDER — CEPHALEXIN 500 MG PO CAPS: 500.0000 mg | ORAL_CAPSULE | Freq: Four times a day (QID) | ORAL | Status: DC

## 2014-01-08 MED ORDER — OXYCODONE-ACETAMINOPHEN 5-325 MG PO TABS: 1.0000 | ORAL_TABLET | ORAL | Status: DC | PRN

## 2014-01-08 NOTE — ED Notes (Signed)
Pt reports that since Wednesday she noticed a lump under she skin on the right side of her groin that is now causing pain down into her vagina. States she has tried to manage at home, but nothing seems to help.

## 2014-01-08 NOTE — ED Notes (Signed)
PA at bedside doing U/S of groin.

## 2014-01-08 NOTE — ED Notes (Signed)
Pelvic exam completed by PA

## 2014-01-08 NOTE — Discharge Instructions (Signed)
Antibiotic Medication Antibiotics are among the most frequently prescribed medicines. Antibiotics cure illness by assisting our body to injure or kill the bacteria that cause infection. While antibiotics are useful to treat a wide variety of infections they are useless against viruses. Antibiotics cannot cure colds, flu, or other viral infections.  There are many types of antibiotics available. Your caregiver will decide which antibiotic will be useful for an illness. Never take or give someone else's antibiotics or left over medicine. Your caregiver may also take into account:  Allergies.  The cost of the medicine.  Dosing schedules.  Taste.  Common side effects when choosing an antibiotic for an infection. Ask your caregiver if you have questions about why a certain medicine was chosen. HOME CARE INSTRUCTIONS Read all instructions and labels on medicine bottles carefully. Some antibiotics should be taken on an empty stomach while others should be taken with food. Taking antibiotics incorrectly may reduce how well they work. Some antibiotics need to be kept in the refrigerator. Others should be kept at room temperature. Ask your caregiver or pharmacist if you do not understand how to give the medicine. Be sure to give the amount of medicine your caregiver has prescribed. Even if you feel better and your symptoms improve, bacteria may still remain alive in the body. Taking all of the medicine will prevent:  The infection from returning and becoming harder to treat.  Complications from partially treated infections. If there is any medicine left over after you have taken the medicine as your caregiver has instructed, throw the medicine away. Be sure to tell your caregiver if you:  Are allergic to any medicines.  Are pregnant or intend to become pregnant while using this medicine.  Are breastfeeding.  Are taking any other prescription, non-prescription medicine, or herbal  remedies.  Have any other medical conditions or problems you have not already discussed. If you are taking birth control pills, they may not work while you are on antibiotics. To avoid unwanted pregnancy:  Continue taking your birth control pills as usual.  Use a second form of birth control (such as condoms) while you are taking antibiotic medicine.  When you finish taking the antibiotic medicine, continue using the second form of birth control until you are finished with your current 1 month cycle of birth control pills. Try not to miss any doses of medicine. If you miss a dose, take it as soon as possible. However, if it is almost time for the next dose and the dosing schedule is:  2 doses a day, take the missed dose and the next dose 5 to 6 hours apart.  3 or more doses a day, take the missed dose and the next dose 2 to 4 hours apart, then go back to the normal schedule.  If you are unable to make up a missed dose, take the next scheduled dose on time and complete the missed dose at the end of the prescribed time for your medicine. SIDE EFFECTS TO TAKING ANTIBIOTICS Common side effects to antibiotic use include:  Soft stools or diarrhea.  Mild stomach upset.  Sun sensitivity. SEEK MEDICAL CARE IF:   If you get worse or do not improve within a few days of starting the medicine.  Vomiting develops.  Diaper rash or rash on the genitals appears.  Vaginal itching occurs.  White patches appear on the tongue or in the mouth.  Severe watery diarrhea and abdominal cramps occur.  Signs of an allergy develop (hives, unknown  itchy rash appears). STOP TAKING THE ANTIBIOTIC. SEEK IMMEDIATE MEDICAL CARE IF:   Urine turns dark or blood colored.  Skin turns yellow.  Easy bruising or bleeding occurs.  Joint pain or muscle aches occur.  Fever returns.  Severe headache occurs.  Signs of an allergy develop (trouble breathing, wheezing, swelling of the lips, face or tongue,  fainting, or blisters on the skin or in the mouth). STOP TAKING THE ANTIBIOTIC. Document Released: 08/20/2004 Document Revised: 03/01/2012 Document Reviewed: 08/30/2009 [facility] Patient Information 2014 [place].  Bacterial Vaginosis Bacterial vaginosis is a vaginal infection that occurs when the normal balance of bacteria in the vagina is disrupted. It results from an overgrowth of certain bacteria. This is the most common vaginal infection in women of childbearing age. Treatment is important to prevent complications, especially in pregnant women, as it can cause a premature delivery. CAUSES  Bacterial vaginosis is caused by an increase in harmful bacteria that are normally present in smaller amounts in the vagina. Several different kinds of bacteria can cause bacterial vaginosis. However, the reason that the condition develops is not fully understood. RISK FACTORS Certain activities or behaviors can put you at an increased risk of developing bacterial vaginosis, including:  Having a new sex partner or multiple sex partners.  Douching.  Using an intrauterine device (IUD) for contraception. Women do not get bacterial vaginosis from toilet seats, bedding, swimming pools, or contact with objects around them. SIGNS AND SYMPTOMS  Some women with bacterial vaginosis have no signs or symptoms. Common symptoms include:  Grey vaginal discharge.  A fishlike odor with discharge, especially after sexual intercourse.  Itching or burning of the vagina and vulva.  Burning or pain with urination. DIAGNOSIS  Your health care provider will take a medical history and examine the vagina for signs of bacterial vaginosis. A sample of vaginal fluid may be taken. Your health care provider will look at this sample under a microscope to check for bacteria and abnormal cells. A vaginal pH test may also be done.  TREATMENT  Bacterial vaginosis may be treated with antibiotic medicines. These may be given in  the form of a pill or a vaginal cream. A second round of antibiotics may be prescribed if the condition comes back after treatment.  HOME CARE INSTRUCTIONS   Only take over-the-counter or prescription medicines as directed by your health care provider.  If antibiotic medicine was prescribed, take it as directed. Make sure you finish it even if you start to feel better.  Do not have sex until treatment is completed.  Tell all sexual partners that you have a vaginal infection. They should see their health care provider and be treated if they have problems, such as a mild rash or itching.  Practice safe sex by using condoms and only having one sex partner. SEEK MEDICAL CARE IF:   Your symptoms are not improving after 3 days of treatment.  You have increased discharge or pain.  You have a fever. MAKE SURE YOU:   Understand these instructions.  Will watch your condition.  Will get help right away if you are not doing well or get worse. FOR MORE INFORMATION  Centers for Disease Control and Prevention, Division of STD Prevention: [employer] American Sexual Health Association (ASHA): www.ashastd.org  Document Released: 12/08/2005 Document Revised: 09/28/2013 Document Reviewed: 07/20/2013 [facility] Patient Information 2014 [place].  Swollen Lymph Nodes The lymphatic system filters fluid from around cells. It is like a system of blood vessels. These channels carry  lymph instead of blood. The lymphatic system is an important part of the immune (disease fighting) system. When people talk about "swollen glands in the neck," they are usually talking about swollen lymph nodes. The lymph nodes are like the little traps for infection. You and your caregiver may be able to feel lymph nodes, especially swollen nodes, in these common areas: the groin (inguinal area), armpits (axilla), and above the clavicle (supraclavicular). You may also feel them in the neck (cervical) and the back of the  head just above the hairline (occipital). Swollen glands occur when there is any condition in which the body responds with an allergic type of reaction. For instance, the glands in the neck can become swollen from insect bites or any type of minor infection on the head. These are very noticeable in children with only minor problems. Lymph nodes may also become swollen when there is a tumor or problem with the lymphatic system, such as Hodgkin's disease. TREATMENT   Most swollen glands do not require treatment. They can be observed (watched) for a short period of time, if your caregiver feels it is necessary. Most of the time, observation is not necessary.  Antibiotics (medicines that kill germs) may be prescribed by your caregiver. Your caregiver may prescribe these if he or she feels the swollen glands are due to a bacterial (germ) infection. Antibiotics are not used if the swollen glands are caused by a virus. HOME CARE INSTRUCTIONS   Take medications as directed by your caregiver. Only take over-the-counter or prescription medicines for pain, discomfort, or fever as directed by your caregiver. SEEK MEDICAL CARE IF:   If you begin to run a temperature greater than 102 F (38.9 C), or as your caregiver suggests. MAKE SURE YOU:   Understand these instructions.  Will watch your condition.  Will get help right away if you are not doing well or get worse. Document Released: 11/28/2002 Document Revised: 03/01/2012 Document Reviewed: 12/08/2005 [facility] Patient Information 2014 [place].  Lymphadenopathy Lymphadenopathy means "disease of the lymph glands." But the term is usually used to describe swollen or enlarged lymph glands, also called lymph nodes. These are the bean-shaped organs found in many locations including the neck, underarm, and groin. Lymph glands are part of the immune system, which fights infections in your body. Lymphadenopathy can occur in just one area of the body,  such as the neck, or it can be generalized, with lymph node enlargement in several areas. The nodes found in the neck are the most common sites of lymphadenopathy. CAUSES  When your immune system responds to germs (such as viruses or bacteria ), infection-fighting cells and fluid build up. This causes the glands to grow in size. This is usually not something to worry about. Sometimes, the glands themselves can become infected and inflamed. This is called lymphadenitis. Enlarged lymph nodes can be caused by many diseases:  Bacterial disease, such as strep throat or a skin infection.  Viral disease, such as a common cold.  Other germs, such as lyme disease, tuberculosis, or sexually transmitted diseases.  Cancers, such as lymphoma (cancer of the lymphatic system) or leukemia (cancer of the white blood cells).  Inflammatory diseases such as lupus or rheumatoid arthritis.  Reactions to medications. Many of the diseases above are rare, but important. This is why you should see your caregiver if you have lymphadenopathy. SYMPTOMS   Swollen, enlarged lumps in the neck, back of the head or other locations.  Tenderness.  Warmth or  redness of the skin over the lymph nodes.  Fever. DIAGNOSIS  Enlarged lymph nodes are often near the source of infection. They can help healthcare providers diagnose your illness. For instance:   Swollen lymph nodes around the jaw might be caused by an infection in the mouth.  Enlarged glands in the neck often signal a throat infection.  Lymph nodes that are swollen in more than one area often indicate an illness caused by a virus. Your caregiver most likely will know what is causing your lymphadenopathy after listening to your history and examining you. Blood tests, x-rays or other tests may be needed. If the cause of the enlarged lymph node cannot be found, and it does not go away by itself, then a biopsy may be needed. Your caregiver will discuss this with  you. TREATMENT  Treatment for your enlarged lymph nodes will depend on the cause. Many times the nodes will shrink to normal size by themselves, with no treatment. Antibiotics or other medicines may be needed for infection. Only take over-the-counter or prescription medicines for pain, discomfort or fever as directed by your caregiver. HOME CARE INSTRUCTIONS  Swollen lymph glands usually return to normal when the underlying medical condition goes away. If they persist, contact your health-care provider. He/she might prescribe antibiotics or other treatments, depending on the diagnosis. Take any medications exactly as prescribed. Keep any follow-up appointments made to check on the condition of your enlarged nodes.  SEEK MEDICAL CARE IF:   Swelling lasts for more than two weeks.  You have symptoms such as weight loss, night sweats, fatigue or fever that does not go away.  The lymph nodes are hard, seem fixed to the skin or are growing rapidly.  Skin over the lymph nodes is red and inflamed. This could mean there is an infection. SEEK IMMEDIATE MEDICAL CARE IF:   Fluid starts leaking from the area of the enlarged lymph node.  You develop a fever of 102 F (38.9 C) or greater.  Severe pain develops (not necessarily at the site of a large lymph node).  You develop chest pain or shortness of breath.  You develop worsening abdominal pain. MAKE SURE YOU:   Understand these instructions.  Will watch your condition.  Will get help right away if you are not doing well or get worse. Document Released: 09/16/2008 Document Revised: 03/01/2012 Document Reviewed: 09/16/2008 [facility] Patient Information 2014 [place].  Urinary Tract Infection A urinary tract infection (UTI) can occur any place along the urinary tract. The tract includes the kidneys, ureters, bladder, and urethra. A type of germ called bacteria often causes a UTI. UTIs are often helped with antibiotic medicine.  HOME CARE    If given, take antibiotics as told by your doctor. Finish them even if you start to feel better.  Drink enough fluids to keep your pee (urine) clear or pale yellow.  Avoid tea, drinks with caffeine, and bubbly (carbonated) drinks.  Pee often. Avoid holding your pee in for a long time.  Pee before and after having sex (intercourse).  Wipe from front to back after you poop (bowel movement) if you are a woman. Use each tissue only once. GET HELP RIGHT AWAY IF:   You have back pain.  You have lower belly (abdominal) pain.  You have chills.  You feel sick to your stomach (nauseous).  You throw up (vomit).  Your burning or discomfort with peeing does not go away.  You have a fever.  Your symptoms are  not better in 3 days. MAKE SURE YOU:   Understand these instructions.  Will watch your condition.  Will get help right away if you are not doing well or get worse. Document Released: 05/26/2008 Document Revised: 09/01/2012 Document Reviewed: 07/08/2012 [facility] Patient Information 2014 [place], [place].

## 2014-01-08 NOTE — ED Provider Notes (Signed)
CSN: [id number]     Arrival date & time 01/08/14  1833 History   First MD Initiated Contact with Patient 01/08/14 2115     Chief Complaint  Patient presents with  . Abscess   (Consider location/radiation/quality/duration/timing/severity/associated sxs/prior Treatment) HPI Comments: Patient is otherwise healthy 43 year old female who presents to the ED with complaints of swelling and tenderness to knot in right groin.  She states that she first noticed this on Wednesday, states that the pain is increasing as this area has increased in size.  She states no fever, chills, dysuria, hematuria, nausea, vomiting, vaginal discharge or bleeding.  She is monogamous, married, sexually active with no history of STD in the past.  She has noticed a thin white discharge but denies abdominal or pelvic pain.    Patient is a 43 y.o. female presenting with abscess. The history is provided by the patient. No language interpreter was used.  Abscess Location:  Ano-genital Ano-genital abscess location:  Groin Abscess quality: painful and warmth   Abscess quality: not draining, no fluctuance and no induration   Red streaking: no   Duration:  3 days Progression:  Worsening Pain details:    Quality:  Sharp and shooting   Severity:  Moderate   Duration:  3 days   Timing:  Constant   Progression:  Worsening Chronicity:  New Context: not diabetes, not injected drug use, not insect bite/sting and not skin injury   Relieved by:  Nothing Worsened by:  Nothing tried Ineffective treatments:  NSAIDs Associated symptoms: no anorexia, no fatigue, no fever, no headaches, no nausea and no vomiting   Risk factors: no prior abscess     Past Medical History  Diagnosis Date  . No pertinent past medical history    Past Surgical History  Procedure Laterality Date  . Shoulder surgery  06/2011    right rotator cuff  . Dilation and curettage of uterus  01/2011   Family History  Problem Relation Age of Onset  .  Anesthesia problems Neg Hx    History  Substance Use Topics  . Smoking status: Never Smoker   . Smokeless tobacco: Not on file  . Alcohol Use: No   OB History   Grav Para Term Preterm Abortions TAB SAB Ect Mult Living   4 4 4  0 0 0 0 0 0 4     Review of Systems  Constitutional: Negative for fever and fatigue.  Gastrointestinal: Negative for nausea, vomiting and anorexia.  Neurological: Negative for headaches.  All other systems reviewed and are negative.    Allergies  Eggs or egg-derived products; Onion; and Tomato  Home Medications   Current Outpatient Rx  Name  Route  Sig  Dispense  Refill  . Multiple Vitamin (MULTIVITAMIN WITH MINERALS) TABS tablet   Oral   Take 1 tablet by mouth daily.          BP 112/66  Pulse 81  Temp(Src) 99 F (37.2 C) (Oral)  Resp 18  SpO2 100%  LMP 12/05/2013 Physical Exam  Nursing note and vitals reviewed. Constitutional: She is oriented to person, place, and time. She appears well-developed and well-nourished. No distress.  HENT:  Head: Normocephalic and atraumatic.  Right Ear: External ear normal.  Left Ear: External ear normal.  Nose: Nose normal.  Mouth/Throat: Oropharynx is clear and moist.  Eyes: Conjunctivae are normal. Pupils are equal, round, and reactive to light. No scleral icterus.  Neck: Normal range of motion. Neck supple.  Cardiovascular: Normal  rate, regular rhythm and normal heart sounds.  Exam reveals no gallop and no friction rub.   No murmur heard. Pulmonary/Chest: Effort normal and breath sounds normal. No respiratory distress. She has no wheezes. She has no rales. She exhibits no tenderness.  Abdominal: Soft. Bowel sounds are normal. She exhibits no distension. There is no tenderness. There is no rebound and no guarding.  Genitourinary: Vagina normal and uterus normal. No vaginal discharge found.  Musculoskeletal: Normal range of motion. She exhibits no edema and no tenderness.  Lymphadenopathy:    She has  no cervical adenopathy.       Right: Inguinal adenopathy present.       Left: No inguinal adenopathy present.  Neurological: She is alert and oriented to person, place, and time. She exhibits normal muscle tone. Coordination normal.  Skin: Skin is warm and dry. No rash noted. No erythema. No pallor.  Psychiatric: She has a normal mood and affect. Her behavior is normal. Judgment and thought content normal.    ED Course  Procedures (including critical care time) Labs Review Labs Reviewed  POCT I-STAT, CHEM 8 - Abnormal; Notable for the following:    Sodium 136 (*)    Glucose, Bld 106 (*)    Hemoglobin 11.9 (*)    HCT 35.0 (*)    All other components within normal limits  GC/CHLAMYDIA PROBE AMP  WET PREP, GENITAL  CBC WITH DIFFERENTIAL  URINALYSIS, ROUTINE W REFLEX MICROSCOPIC  POCT PREGNANCY, URINE   Imaging Review No results found.  EKG Interpretation   None      Results for orders placed during the hospital encounter of 01/08/14  WET PREP, GENITAL      Result Value Range   Yeast Wet Prep HPF POC NONE SEEN  NONE SEEN   Trich, Wet Prep NONE SEEN  NONE SEEN   Clue Cells Wet Prep HPF POC FEW (*) NONE SEEN   WBC, Wet Prep HPF POC FEW (*) NONE SEEN  CBC WITH DIFFERENTIAL      Result Value Range   WBC 6.4  4.0 - 10.5 K/uL   RBC 3.69 (*) 3.87 - 5.11 MIL/uL   Hemoglobin 11.5 (*) 12.0 - 15.0 g/dL   HCT 33.5 (*) 36.0 - 46.0 %   MCV 90.8  78.0 - 100.0 fL   MCH 31.2  26.0 - 34.0 pg   MCHC 34.3  30.0 - 36.0 g/dL   RDW 12.3  11.5 - 15.5 %   Platelets 215  150 - 400 K/uL   Neutrophils Relative % 63  43 - 77 %   Neutro Abs 4.1  1.7 - 7.7 K/uL   Lymphocytes Relative 26  12 - 46 %   Lymphs Abs 1.7  0.7 - 4.0 K/uL   Monocytes Relative 10  3 - 12 %   Monocytes Absolute 0.6  0.1 - 1.0 K/uL   Eosinophils Relative 1  0 - 5 %   Eosinophils Absolute 0.0  0.0 - 0.7 K/uL   Basophils Relative 1  0 - 1 %   Basophils Absolute 0.0  0.0 - 0.1 K/uL  URINALYSIS, ROUTINE W REFLEX MICROSCOPIC       Result Value Range   Color, Urine YELLOW  YELLOW   APPearance CLOUDY (*) CLEAR   Specific Gravity, Urine 1.030  1.005 - 1.030   pH 6.5  5.0 - 8.0   Glucose, UA NEGATIVE  NEGATIVE mg/dL   Hgb urine dipstick NEGATIVE  NEGATIVE   Bilirubin Urine NEGATIVE  NEGATIVE   Ketones, ur NEGATIVE  NEGATIVE mg/dL   Protein, ur NEGATIVE  NEGATIVE mg/dL   Urobilinogen, UA 1.0  0.0 - 1.0 mg/dL   Nitrite NEGATIVE  NEGATIVE   Leukocytes, UA SMALL (*) NEGATIVE  URINE MICROSCOPIC-ADD ON      Result Value Range   Squamous Epithelial / LPF FEW (*) RARE   WBC, UA 0-2  <3 WBC/hpf   RBC / HPF 0-2  <3 RBC/hpf   Bacteria, UA MANY (*) RARE  POCT I-STAT, CHEM 8      Result Value Range   Sodium 136 (*) 137 - 147 mEq/L   Potassium 4.1  3.7 - 5.3 mEq/L   Chloride 100  96 - 112 mEq/L   BUN 7  6 - 23 mg/dL   Creatinine, Ser 0.90  0.50 - 1.10 mg/dL   Glucose, Bld 106 (*) 70 - 99 mg/dL   Calcium, Ion 1.21  1.12 - 1.23 mmol/L   TCO2 26  0 - 100 mmol/L   Hemoglobin 11.9 (*) 12.0 - 15.0 g/dL   HCT 35.0 (*) 36.0 - 46.0 %  POCT PREGNANCY, URINE      Result Value Range   Preg Test, Ur NEGATIVE  NEGATIVE   No results found.   EMERGENCY DEPARTMENT US SOFT TISSUE INTERPRETATION "Study: Limited Ultrasound of the noted body part in comments below"  INDICATIONS: abscess Multiple views of the body part are obtained with a multi-frequency linear probe  PERFORMED BY: Ignacia Felling, PA-C, under the direction of S. Rancour, MD  IMAGES ARCHIVED?: Yes  SIDE:Right  BODY PART:Inguinal area  FINDINGS: Right inguinal lymph node enlarged, does not appear to be abscess.  LIMITATIONS:  None  INTERPRETATION: Right inguinal lymphadenopathy  COMMENT:  Reviewed by Dr. Wyvonnia Dusky who was personally present for the examination. Medications  cephALEXin (KEFLEX) capsule 500 mg (not administered)  metroNIDAZOLE (FLAGYL) tablet 500 mg (not administered)  oxyCODONE-acetaminophen (PERCOCET/ROXICET) 5-325 MG per tablet 1  tablet (1 tablet Oral Given 01/08/14 2200)     MDM  Right inguinal lymphadenitis UTI BV  Patient is otherwise healthy 43 year old female who presents with pain and swelling to right groin.  Pelvic exam shows thin white discharge, + clue cells, c/w BV.  Also with UTI.  Will treat with rest, pain control, warm compresses, abx.  First dose given here.    Idalia Needle Joelyn Oms, PA-C 01/08/14 2345

## 2014-01-09 LAB — GC/CHLAMYDIA PROBE AMP
CT Probe RNA: NEGATIVE
GC Probe RNA: NEGATIVE

## 2014-01-09 NOTE — ED Provider Notes (Signed)
Medical screening examination/treatment/procedure(s) were conducted as a shared visit with non-physician practitioner(s) and myself.  I personally evaluated the patient during the encounter.  Swelling and tenderness to R groin. Hx shaved pubic region. No fever. No fluctuance. US imaging not consistent with abscess. Suspect inflamed lymph node.  EKG Interpretation   None        Ezequiel Essex, MD 01/09/14 [phone]

## 2014-02-20 ENCOUNTER — Inpatient Hospital Stay (HOSPITAL_COMMUNITY)
Admission: AD | Admit: 2014-02-20 | Discharge: 2014-02-20 | Discharge status: Against Medical Advice | Payer: Self-pay | Source: Ambulatory Visit | Attending: Obstetrics & Gynecology | Admitting: Obstetrics & Gynecology

## 2014-02-20 NOTE — MAU Note (Signed)
Patient is not in the lobby when called to triage. Admission states the patient left.

## 2014-02-20 NOTE — MAU Note (Signed)
Pt not in lobby.  

## 2014-02-21 ENCOUNTER — Inpatient Hospital Stay (HOSPITAL_COMMUNITY): Payer: Self-pay

## 2014-02-21 ENCOUNTER — Inpatient Hospital Stay (HOSPITAL_COMMUNITY)
Admission: AD | Admit: 2014-02-21 | Discharge: 2014-02-21 | Disposition: A | Discharge status: Home / Self Care | Payer: Self-pay | Source: Ambulatory Visit | Attending: Obstetrics and Gynecology | Admitting: Obstetrics and Gynecology

## 2014-02-21 ENCOUNTER — Encounter (HOSPITAL_COMMUNITY): Payer: Self-pay | Admitting: *Deleted

## 2014-02-21 DIAGNOSIS — N926 Irregular menstruation, unspecified: Secondary | ICD-10-CM | POA: Insufficient documentation

## 2014-02-21 DIAGNOSIS — N946 Dysmenorrhea, unspecified: Secondary | ICD-10-CM | POA: Insufficient documentation

## 2014-02-21 DIAGNOSIS — D251 Intramural leiomyoma of uterus: Secondary | ICD-10-CM | POA: Insufficient documentation

## 2014-02-21 DIAGNOSIS — N939 Abnormal uterine and vaginal bleeding, unspecified: Secondary | ICD-10-CM

## 2014-02-21 DIAGNOSIS — N925 Other specified irregular menstruation: Secondary | ICD-10-CM | POA: Insufficient documentation

## 2014-02-21 DIAGNOSIS — R109 Unspecified abdominal pain: Secondary | ICD-10-CM | POA: Insufficient documentation

## 2014-02-21 DIAGNOSIS — N949 Unspecified condition associated with female genital organs and menstrual cycle: Secondary | ICD-10-CM | POA: Insufficient documentation

## 2014-02-21 DIAGNOSIS — N938 Other specified abnormal uterine and vaginal bleeding: Secondary | ICD-10-CM | POA: Insufficient documentation

## 2014-02-21 LAB — CBC
HEMATOCRIT: 33.7 % — AB (ref 36.0–46.0)
HEMOGLOBIN: 11.6 g/dL — AB (ref 12.0–15.0)
MCH: 30.6 pg (ref 26.0–34.0)
MCHC: 34.4 g/dL (ref 30.0–36.0)
MCV: 88.9 fL (ref 78.0–100.0)
Platelets: 202 10*3/uL (ref 150–400)
RBC: 3.79 MIL/uL — ABNORMAL LOW (ref 3.87–5.11)
RDW: 12.5 % (ref 11.5–15.5)
WBC: 3.8 10*3/uL — ABNORMAL LOW (ref 4.0–10.5)

## 2014-02-21 LAB — WET PREP, GENITAL
TRICH WET PREP: NONE SEEN
YEAST WET PREP: NONE SEEN

## 2014-02-21 LAB — URINALYSIS, ROUTINE W REFLEX MICROSCOPIC
BILIRUBIN URINE: NEGATIVE
Glucose, UA: NEGATIVE mg/dL
KETONES UR: NEGATIVE mg/dL
Leukocytes, UA: NEGATIVE
Nitrite: NEGATIVE
PH: 6.5 (ref 5.0–8.0)
Protein, ur: NEGATIVE mg/dL
Specific Gravity, Urine: 1.025 (ref 1.005–1.030)
Urobilinogen, UA: 0.2 mg/dL (ref 0.0–1.0)

## 2014-02-21 LAB — POCT PREGNANCY, URINE: Preg Test, Ur: NEGATIVE

## 2014-02-21 LAB — URINE MICROSCOPIC-ADD ON

## 2014-02-21 MED ORDER — OXYCODONE-ACETAMINOPHEN 5-325 MG PO TABS
1.0000 | ORAL_TABLET | Freq: Once | ORAL | Status: AC
  Administered 2014-02-21: 1 via ORAL
  Filled 2014-02-21: qty 1

## 2014-02-21 MED ORDER — OXYCODONE-ACETAMINOPHEN 5-325 MG PO TABS: 1.0000 | ORAL_TABLET | Freq: Four times a day (QID) | ORAL | Status: DC | PRN

## 2014-02-21 MED ORDER — OXYCODONE-ACETAMINOPHEN 5-325 MG PO TABS
1.0000 | ORAL_TABLET | Freq: Once | ORAL | Status: DC
  Filled 2014-02-21: qty 1

## 2014-02-21 NOTE — MAU Note (Signed)
Bleeding and cramping since first of February. Off and on, sometimes spotting, sometimes heavier. Cramping in lower abdomen and hips. Started having irregular menses last October. Had BTL about 16 years ago.

## 2014-02-21 NOTE — MAU Provider Note (Signed)
History     CSN: [id number]  Arrival date and time: 02/21/14 1415   First Provider Initiated Contact with Patient 02/21/14 1551      Chief Complaint  Patient presents with  . Dysmenorrhea  . Vaginal Bleeding   HPI [name] is a 43 y.o. [phone] who presents to MAU today with complaint of vaginal bleeding x 1 month. The patient states bleeding has been off and on since early February. She denies a history of irregular bleeding. She states that sometimes the bleeding is heavy requiring her to change her pad q 2 hours. She states moderate bleeding today. She does not have clots. She does endorse associated lower abdominal pain off and on since Sunday, but worse today. She rates her pain at 8/10 now. She took Tylenol and Ibuprofen with little relief. She does endorse fatigue and nausea without vomiting, diarrhea or constipation. She denies weakness, dizziness, fever, vaginal discharge or UTI symptoms.   OB History   Grav Para Term Preterm Abortions TAB SAB Ect Mult Living   4 4 4  0 0 0 0 0 0 4      Past Medical History  Diagnosis Date  . No pertinent past medical history     Past Surgical History  Procedure Laterality Date  . Shoulder surgery  06/2011    right rotator cuff  . Dilation and curettage of uterus  01/2011    Family History  Problem Relation Age of Onset  . Anesthesia problems Neg Hx   . Diabetes Mother   . Diabetes Father     History  Substance Use Topics  . Smoking status: Never Smoker   . Smokeless tobacco: Not on file  . Alcohol Use: No    Allergies:  Allergies  Allergen Reactions  . Eggs Or Egg-Derived Products Hives  . Onion Hives  . Tomato Hives    Prescriptions prior to admission  Medication Sig Dispense Refill  . acetaminophen (TYLENOL) 500 MG tablet Take 1,000 mg by mouth every 6 (six) hours as needed for moderate pain.      Marland Kitchen ibuprofen (ADVIL,MOTRIN) 200 MG tablet Take 400 mg by mouth every 6 (six) hours as needed for moderate  pain.      . Multiple Vitamin (MULTIVITAMIN WITH MINERALS) TABS tablet Take 1 tablet by mouth daily.        Review of Systems  Constitutional: Positive for malaise/fatigue. Negative for fever.  Gastrointestinal: Positive for nausea and abdominal pain. Negative for vomiting, diarrhea and constipation.  Genitourinary: Negative for dysuria, urgency and frequency.       + vaginal bleeding Neg - vaginal discharge  Neurological: Negative for dizziness, loss of consciousness and weakness.   Physical Exam   Blood pressure 104/70, pulse 68, temperature 98.7 F (37.1 C), temperature source Oral, resp. rate 18, height 5\' 3"  (1.6 m), weight 192 lb 9.6 oz (87.363 kg), last menstrual period 01/24/2014, SpO2 100.00%.  Physical Exam  Constitutional: She is oriented to person, place, and time. She appears well-developed and well-nourished. No distress.  HENT:  Head: Normocephalic and atraumatic.  Cardiovascular: Normal rate.   Respiratory: Effort normal.  GI: Soft. Bowel sounds are normal. She exhibits no distension and no mass. There is tenderness (mild tenderness to palpation of the lower abdomen bilaterally). There is no rebound and no guarding.  Genitourinary: Uterus is not enlarged and not tender. Cervix exhibits no motion tenderness, no discharge and no friability. Right adnexum displays no mass and no tenderness. Left adnexum  displays no mass and no tenderness. There is bleeding (small amount of blood noted in the vaginal vault) around the vagina. No vaginal discharge found.  Neurological: She is alert and oriented to person, place, and time.  Skin: Skin is warm and dry. No erythema.  Psychiatric: She has a normal mood and affect.   Results for orders placed during the hospital encounter of 02/21/14 (from the past 24 hour(s))  URINALYSIS, ROUTINE W REFLEX MICROSCOPIC     Status: Abnormal   Collection Time    02/21/14  2:40 PM      Result Value Ref Range   Color, Urine YELLOW  YELLOW    APPearance CLEAR  CLEAR   Specific Gravity, Urine 1.025  1.005 - 1.030   pH 6.5  5.0 - 8.0   Glucose, UA NEGATIVE  NEGATIVE mg/dL   Hgb urine dipstick LARGE (*) NEGATIVE   Bilirubin Urine NEGATIVE  NEGATIVE   Ketones, ur NEGATIVE  NEGATIVE mg/dL   Protein, ur NEGATIVE  NEGATIVE mg/dL   Urobilinogen, UA 0.2  0.0 - 1.0 mg/dL   Nitrite NEGATIVE  NEGATIVE   Leukocytes, UA NEGATIVE  NEGATIVE  URINE MICROSCOPIC-ADD ON     Status: Abnormal   Collection Time    02/21/14  2:40 PM      Result Value Ref Range   Squamous Epithelial / LPF FEW (*) RARE   WBC, UA 0-2  <3 WBC/hpf   RBC / HPF 7-10  <3 RBC/hpf  POCT PREGNANCY, URINE     Status: None   Collection Time    02/21/14  3:12 PM      Result Value Ref Range   Preg Test, Ur NEGATIVE  NEGATIVE  WET PREP, GENITAL     Status: Abnormal   Collection Time    02/21/14  4:05 PM      Result Value Ref Range   Yeast Wet Prep HPF POC NONE SEEN  NONE SEEN   Trich, Wet Prep NONE SEEN  NONE SEEN   Clue Cells Wet Prep HPF POC FEW (*) NONE SEEN   WBC, Wet Prep HPF POC FEW (*) NONE SEEN  CBC     Status: Abnormal   Collection Time    02/21/14  4:15 PM      Result Value Ref Range   WBC 3.8 (*) 4.0 - 10.5 K/uL   RBC 3.79 (*) 3.87 - 5.11 MIL/uL   Hemoglobin 11.6 (*) 12.0 - 15.0 g/dL   HCT 33.7 (*) 36.0 - 46.0 %   MCV 88.9  78.0 - 100.0 fL   MCH 30.6  26.0 - 34.0 pg   MCHC 34.4  30.0 - 36.0 g/dL   RDW 12.5  11.5 - 15.5 %   Platelets 202  150 - 400 K/uL   US Transvaginal Non-ob  02/21/2014   CLINICAL DATA:  Bleeding for past month. Prior tubal ligation and D and C.  EXAM: TRANSABDOMINAL AND TRANSVAGINAL ULTRASOUND OF PELVIS  TECHNIQUE: Both transabdominal and transvaginal ultrasound examinations of the pelvis were performed. Transabdominal technique was performed for global imaging of the pelvis including uterus, ovaries, adnexal regions, and pelvic cul-de-sac.  COMPARISON:  03/10/2012.  FINDINGS: Uterus  Measurements: 9.8 x 4.6 x 5.5 cm. 1.9 x 1.7 x 2.2  cm right anterior intramural fibroid previously measured 1.5 x 1.5 x 2.2 cm.  Endometrium  Thickness: 6.0 mm.  No focal abnormality visualized.  Right ovary  Measurements: 2.6 x 1.7 x 2.0 cm. In addition to right ovary an  1.6 cm follicle/ cyst, there is a 1.7 cm cyst inferior to the right ovary which may represent a paraovarian cyst.  Left ovary  Measurements: 3.4 x 1.7 x 2.3 cm. Normal appearance/no adnexal mass.  Other findings  No free fluid.  IMPRESSION: 1.9 x 1.7 x 2.2 cm right anterior intramural fibroid previously measured 1.5 x 1.5 x 2.2 cm.  Endometrial thickness within normal limits without focal endometrial mass identified.  Question 1.7 cm right paraovarian cyst (inferior to the right ovary).   Electronically Signed   By: Chauncey Cruel M.D.   On: 02/21/2014 17:32   US Pelvis Complete  02/21/2014   CLINICAL DATA:  Bleeding for past month. Prior tubal ligation and D and C.  EXAM: TRANSABDOMINAL AND TRANSVAGINAL ULTRASOUND OF PELVIS  TECHNIQUE: Both transabdominal and transvaginal ultrasound examinations of the pelvis were performed. Transabdominal technique was performed for global imaging of the pelvis including uterus, ovaries, adnexal regions, and pelvic cul-de-sac.  COMPARISON:  03/10/2012.  FINDINGS: Uterus  Measurements: 9.8 x 4.6 x 5.5 cm. 1.9 x 1.7 x 2.2 cm right anterior intramural fibroid previously measured 1.5 x 1.5 x 2.2 cm.  Endometrium  Thickness: 6.0 mm.  No focal abnormality visualized.  Right ovary  Measurements: 2.6 x 1.7 x 2.0 cm. In addition to right ovary an 1.6 cm follicle/ cyst, there is a 1.7 cm cyst inferior to the right ovary which may represent a paraovarian cyst.  Left ovary  Measurements: 3.4 x 1.7 x 2.3 cm. Normal appearance/no adnexal mass.  Other findings  No free fluid.  IMPRESSION: 1.9 x 1.7 x 2.2 cm right anterior intramural fibroid previously measured 1.5 x 1.5 x 2.2 cm.  Endometrial thickness within normal limits without focal endometrial mass identified.  Question  1.7 cm right paraovarian cyst (inferior to the right ovary).   Electronically Signed   By: Chauncey Cruel M.D.   On: 02/21/2014 17:32    MAU Course  Procedures None  MDM UPT - Negative UA, wet prep, GC/Chlamydia, CBC and [place] today Percocet given in MAU Patient is hemodynamically stable  Assessment and Plan  A: AUB Small intramural fibroid  P: Discharge home Rx for Percocet given to patient Patient referred to [facility] clinic for further evaluation of AUB. Appoitnment given for 02/23/14 @ 2:00 pm Bleeding precautions discussed Patient may return to MAU as needed or if her condition were to change or worsen  Farris Has, PA-C  02/21/2014, 5:37 PM

## 2014-02-21 NOTE — MAU Note (Signed)
Not on any BC and has not stopped or changed any BC;

## 2014-02-21 NOTE — Discharge Instructions (Signed)
Abnormal Uterine Bleeding Abnormal uterine bleeding means bleeding from the vagina that is not your normal menstrual period. This can be:  Bleeding or spotting between periods.  Bleeding after sex (sexual intercourse).  Bleeding that is heavier or more than normal.  Periods that last longer than usual.  Bleeding after menopause. There are many problems that may cause this. Treatment will depend on the cause of the bleeding. Any kind of bleeding that is not normal should be reviewed by your doctor.  HOME CARE Watch your condition for any changes. These actions may lessen any discomfort you are having:  Do not use tampons or douches as told by your doctor.  Change your pads often. You should get regular pelvic exams and Pap tests. Keep all appointments for tests as told by your doctor. GET HELP IF:  You are bleeding for more than 1 week.  You feel dizzy at times. GET HELP RIGHT AWAY IF:   You pass out.  You have to change pads every 15 to 30 minutes.  You have belly pain.  You have a fever.  You become sweaty or weak.  You are passing large blood clots from the vagina.  You feel sick to your stomach (nauseous) and throw up (vomit). MAKE SURE YOU:  Understand these instructions.  Will watch your condition.  Will get help right away if you are not doing well or get worse. Document Released: 10/05/2009 Document Revised: 09/28/2013 Document Reviewed: 07/07/2013 [facility] Patient Information 2014 [place], [place].  Endometrial Biopsy Endometrial biopsy is a procedure in which a tissue sample is taken from inside the uterus. The tissue sample is then looked at under a microscope to see if the tissue is normal or abnormal. The endometrium is the lining of the uterus. This procedure helps determine where you are in your menstrual cycle and how hormone levels are affecting the lining of the uterus. This procedure may also be used to evaluate uterine bleeding or to diagnose  endometrial cancer, tuberculosis, polyps, or inflammatory conditions.  LET [facility] CARE PROVIDER KNOW ABOUT:  Any allergies you have.  All medicines you are taking, including vitamins, herbs, eye drops, creams, and over-the-counter medicines.  Previous problems you or members of your family have had with the use of anesthetics.  Any blood disorders you have.  Previous surgeries you have had.  Medical conditions you have.  Possibility of pregnancy. RISKS AND COMPLICATIONS Generally, this is a safe procedure. However, as with any procedure, complications can occur. Possible complications include:  Bleeding.  Pelvic infection.  Puncture of the uterine wall with the biopsy device (rare). BEFORE THE PROCEDURE   Keep a record of your menstrual cycles as directed by your health care provider. You may need to schedule your procedure for a specific time in your cycle.  You may want to bring a sanitary pad to wear home after the procedure.  Arrange for someone to drive you home after the procedure if you will be given a medicine to help you relax (sedative). PROCEDURE   You may be given a sedative to relax you.  You will lie on an exam table with your feet and legs supported as in a pelvic exam.  Your health care provider will insert an instrument (speculum) into your vagina to see your cervix.  Your cervix will be cleansed with an antiseptic solution. A medicine (local anesthetic) will be used to numb the cervix.  A forceps instrument (tenaculum) will be used to hold your cervix steady  for the biopsy.  A thin, rodlike instrument (uterine sound) will be inserted through your cervix to determine the length of your uterus and the location where the biopsy sample will be removed.  A thin, flexible tube (catheter) will be inserted through your cervix and into the uterus. The catheter is used to collect the biopsy sample from your endometrial tissue.  The catheter and speculum  will then be removed, and the tissue sample will be sent to a lab for examination. AFTER THE PROCEDURE  You will rest in a recovery area until you are ready to go home.  You may have mild cramping and a small amount of vaginal bleeding for a few days after the procedure. This is normal.  Make sure you find out how to get your test results. Document Released: 04/10/2005 Document Revised: 08/10/2013 Document Reviewed: 05/25/2013 [facility] Patient Information 2014 [place], [place].

## 2014-02-21 NOTE — MAU Note (Signed)
C/o heavy vaginal bleeding since Feb1st;

## 2014-02-22 LAB — GC/CHLAMYDIA PROBE AMP
CT Probe RNA: NEGATIVE
GC PROBE AMP APTIMA: NEGATIVE

## 2014-02-23 ENCOUNTER — Encounter: Payer: Self-pay | Admitting: Obstetrics and Gynecology

## 2014-02-23 ENCOUNTER — Other Ambulatory Visit (HOSPITAL_COMMUNITY)
Admission: RE | Admit: 2014-02-23 | Discharge: 2014-02-23 | Disposition: A | Discharge status: Home / Self Care | Payer: Self-pay | Source: Ambulatory Visit | Attending: Obstetrics and Gynecology | Admitting: Obstetrics and Gynecology

## 2014-02-23 ENCOUNTER — Ambulatory Visit (INDEPENDENT_AMBULATORY_CARE_PROVIDER_SITE_OTHER): Payer: Self-pay | Admitting: Obstetrics and Gynecology

## 2014-02-23 VITALS — BP 105/73 | HR 65 | Temp 98.4°F | Ht 63.0 in | Wt 190.0 lb

## 2014-02-23 DIAGNOSIS — N926 Irregular menstruation, unspecified: Secondary | ICD-10-CM | POA: Insufficient documentation

## 2014-02-23 DIAGNOSIS — N939 Abnormal uterine and vaginal bleeding, unspecified: Secondary | ICD-10-CM

## 2014-02-23 MED ORDER — MEDROXYPROGESTERONE ACETATE 10 MG PO TABS
10.0000 mg | ORAL_TABLET | Freq: Every day | ORAL | Status: DC
Start: 2014-02-23 — End: 2014-05-15

## 2014-02-23 NOTE — Progress Notes (Signed)
Patient ID: Toni Chang, female   DOB: [birth year], 43 y.o.   MRN: [id number] 43 yo G4P4 presenting today for evaluation of abnormal uterine bleeding which has been present for the past month. Prior to this episode, she reports regular monthly periods which last 5 days. She is sexually active using BTL for contraception but is hoping to conceive.   Past Medical History  Diagnosis Date  . No pertinent past medical history    Past Surgical History  Procedure Laterality Date  . Shoulder surgery  06/2011    right rotator cuff  . Dilation and curettage of uterus  01/2011   Family History  Problem Relation Age of Onset  . Anesthesia problems Neg Hx   . Diabetes Mother   . Diabetes Father    History  Substance Use Topics  . Smoking status: Never Smoker   . Smokeless tobacco: Not on file  . Alcohol Use: No   GENERAL: Well-developed, well-nourished female in no acute distress.  HEENT: Normocephalic, atraumatic. Sclerae anicteric.  NECK: Supple. Normal thyroid.  LUNGS: Clear to auscultation bilaterally.  HEART: Regular rate and rhythm. BREASTS: Symmetric in size. No palpable masses or lymphadenopathy, skin changes, or nipple drainage. ABDOMEN: Soft, nontender, nondistended. No organomegaly. PELVIC: Normal external female genitalia. Vagina is pink and rugated.  Normal discharge. Normal appearing cervix. Uterus is normal in size.  No adnexal mass or tenderness. EXTREMITIES: No cyanosis, clubbing, or edema, 2+ distal pulses.  US Pelvis Complete  02/21/2014 CLINICAL DATA: Bleeding for past month. Prior tubal ligation and D and C. EXAM: TRANSABDOMINAL AND TRANSVAGINAL ULTRASOUND OF PELVIS TECHNIQUE: Both transabdominal and transvaginal ultrasound examinations of the pelvis were performed. Transabdominal technique was performed for global imaging of the pelvis including uterus, ovaries, adnexal regions, and pelvic cul-de-sac. COMPARISON: 03/10/2012. FINDINGS: Uterus Measurements: 9.8 x 4.6 x 5.5  cm. 1.9 x 1.7 x 2.2 cm right anterior intramural fibroid previously measured 1.5 x 1.5 x 2.2 cm. Endometrium Thickness: 6.0 mm. No focal abnormality visualized. Right ovary Measurements: 2.6 x 1.7 x 2.0 cm. In addition to right ovary an 1.6 cm follicle/ cyst, there is a 1.7 cm cyst inferior to the right ovary which may represent a paraovarian cyst. Left ovary Measurements: 3.4 x 1.7 x 2.3 cm. Normal appearance/no adnexal mass. Other findings No free fluid. IMPRESSION: 1.9 x 1.7 x 2.2 cm right anterior intramural fibroid previously measured 1.5 x 1.5 x 2.2 cm. Endometrial thickness within normal limits without focal endometrial mass identified. Question 1.7 cm right paraovarian cyst (inferior to the right ovary). Electronically Signed By: Chauncey Cruel M.D. On: 02/21/2014 17:32   A/P 43 yo with abnormal uterine bleeding -- Endometrial biopsy ENDOMETRIAL BIOPSY     The indications for endometrial biopsy were reviewed.   Risks of the biopsy including cramping, bleeding, infection, uterine perforation, inadequate specimen and need for additional procedures  were discussed. The patient states she understands and agrees to undergo procedure today. Consent was signed. Time out was performed. Urine HCG was negative. A sterile speculum was placed in the patient's vagina and the cervix was prepped with Betadine. A single-toothed tenaculum was placed on the anterior lip of the cervix to stabilize it. The uterine cavity was sounded to a depth of 9 cm using the uterine sound. The 3 mm pipelle was introduced into the endometrial cavity without difficulty, 2 passes were made.  A  moderate amount of tissue was  sent to pathology. The instruments were removed from the patient's vagina. Minimal bleeding from  the cervix was noted. The patient tolerated the procedure well.  Routine post-procedure instructions were given to the patient. The patient will follow up in two weeks to review the results and for further management.   -  Patient is not interested in medical or surgical management of her abnormal bleeding. She would like to wait and see what happens to her cycles from this point on as she is trying to get pregnant. I had a long discussion with the patient regarding the fact that spontaneous conception is unlikely to occur given her BTL. Advised to consult with infertility specialist regarding cost and success rate of reversal at 43 yo vs IVF - Rx provera provided in the meantime - Patient will be contacted with any abnormal results

## 2014-02-27 NOTE — MAU Provider Note (Signed)
Attestation of Attending Supervision of Advanced Practitioner (CNM/NP): Evaluation and management procedures were performed by the Advanced Practitioner under my supervision and collaboration.  I have reviewed the Advanced Practitioner's note and chart, and I agree with the management and plan.  Cathalina Barcia 02/27/2014 8:44 AM

## 2014-02-28 ENCOUNTER — Telehealth: Payer: Self-pay | Admitting: *Deleted

## 2014-02-28 NOTE — Telephone Encounter (Addendum)
Message copied by Langston Reusing on Tue Feb 28, 2014  8:26 AM ------      Message from: CONSTANT, [place]      Created: Mon Feb 27, 2014  2:59 PM       Please inform patient of negative endometrial biopsy. Patient to follow up when ready for medical or surgical management of her bleeding, if it remains a persistent issue.            Thanks            Peggy ------ Called pt and informed her of biopsy results. If her bleeding continues to be an issue and she would like medical or surgical management, she should call back for appt. Pt voiced understanding.

## 2014-03-01 ENCOUNTER — Encounter: Payer: Self-pay | Admitting: *Deleted

## 2014-03-05 IMAGING — US US PELVIS COMPLETE
1 series · 13 of 25 positions shown · non-contrast
Comparison: 03/10/2012.

CLINICAL DATA: Bleeding for past month. Prior tubal ligation and D
and C.

EXAM:
TRANSABDOMINAL AND TRANSVAGINAL ULTRASOUND OF PELVIS
TECHNIQUE: Both transabdominal and transvaginal ultrasound examinations of the
pelvis were performed. Transabdominal technique was performed for
global imaging of the pelvis including uterus, ovaries, adnexal
regions, and pelvic cul-de-sac.

[Series 1: us pelvis complete · 13 of 61 slices shown]
[im 1/61]
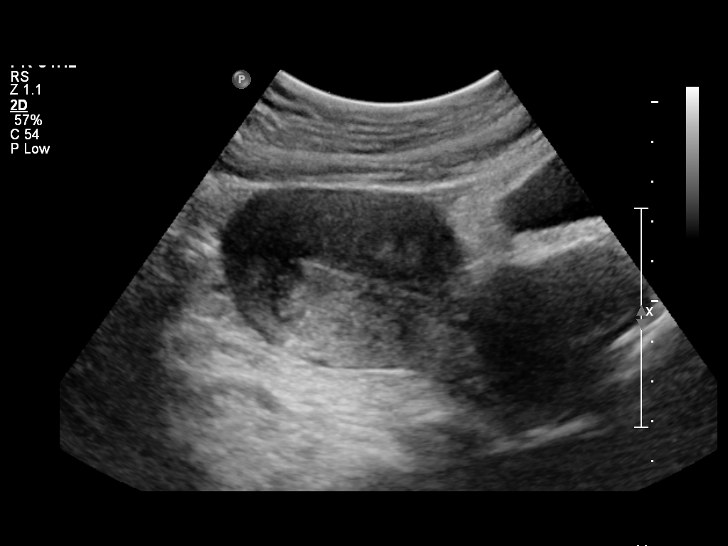
[im 6/61]
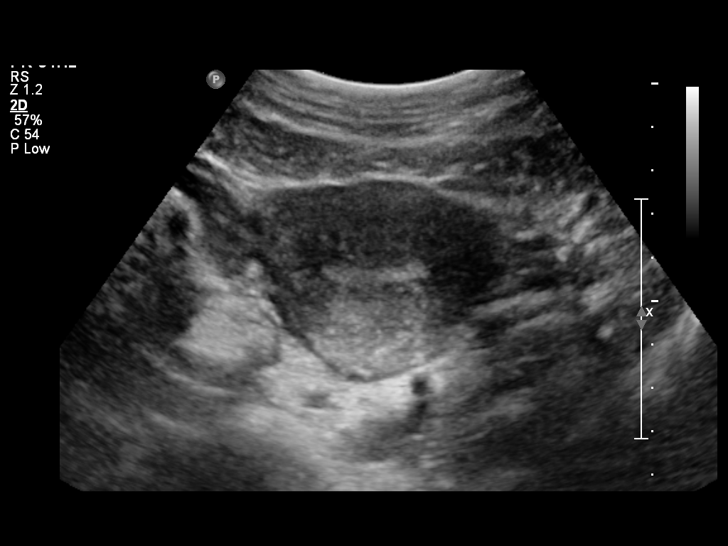
[im 11/61]
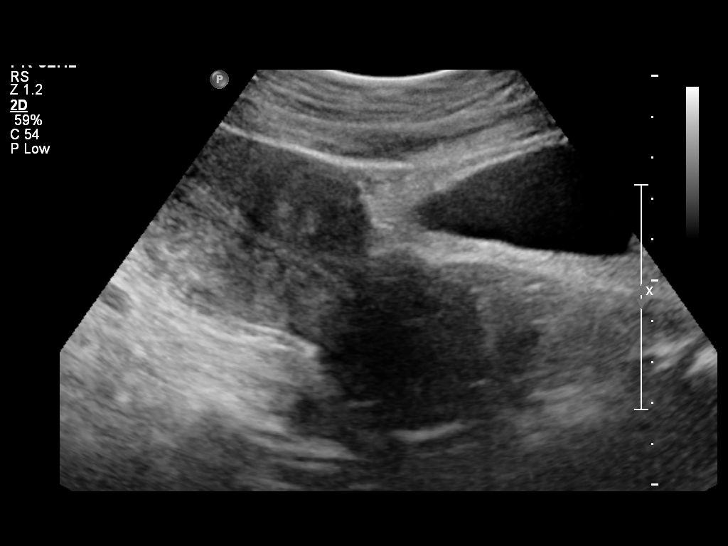
[im 16/61]
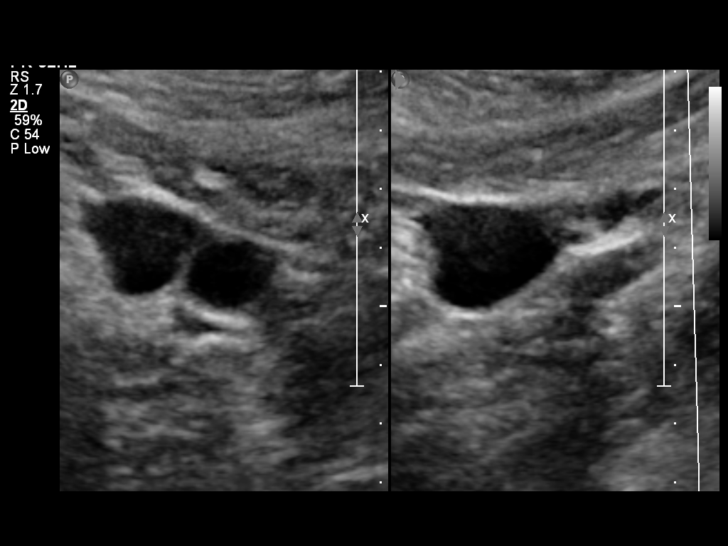
[im 21/61]
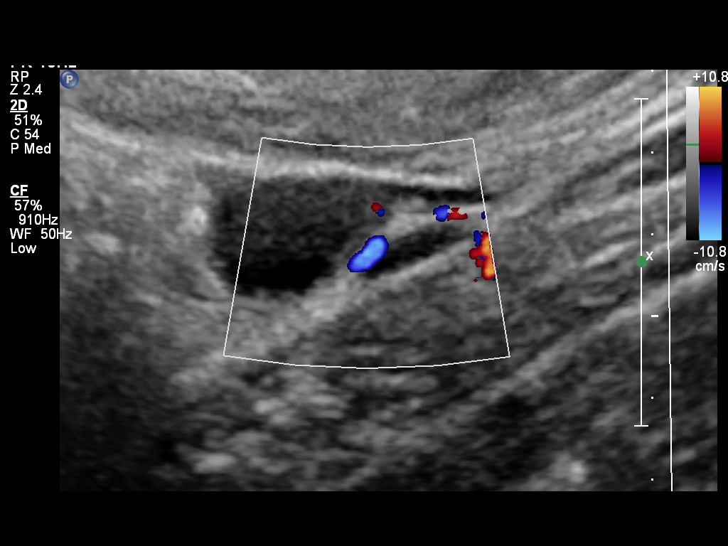
[im 26/61]
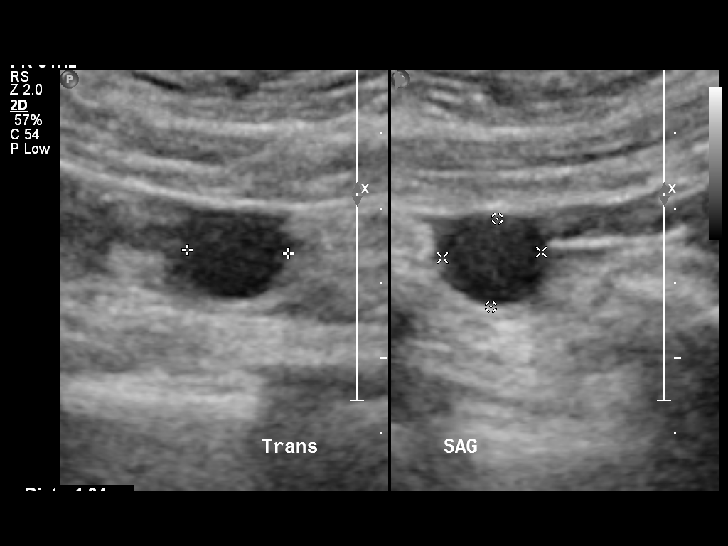
[im 31/61]
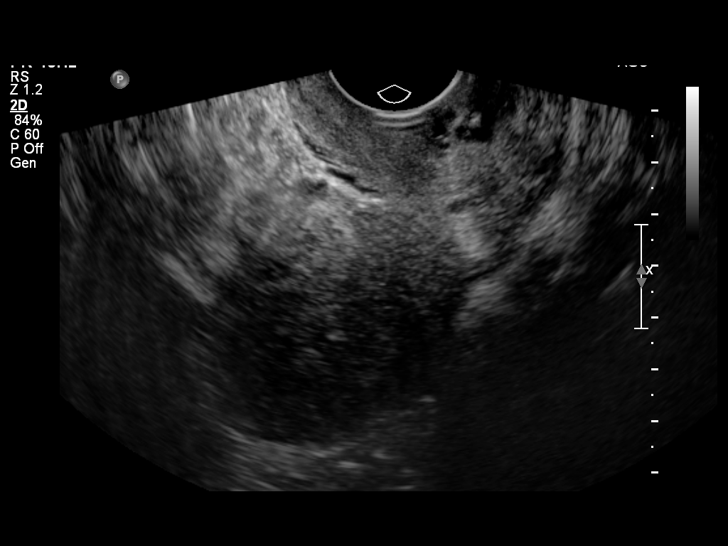
[im 36/61]
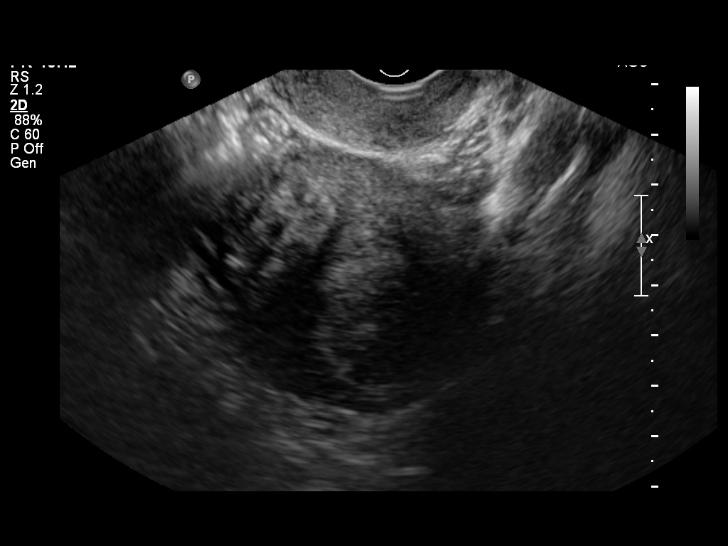
[im 41/61]
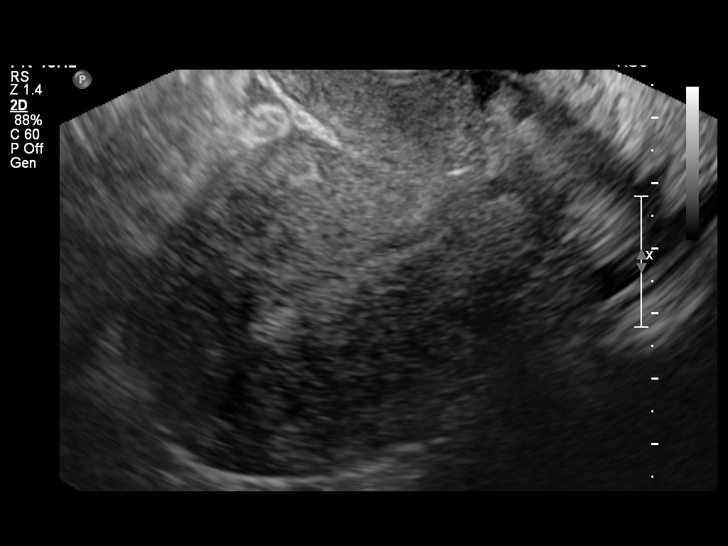
[im 46/61]
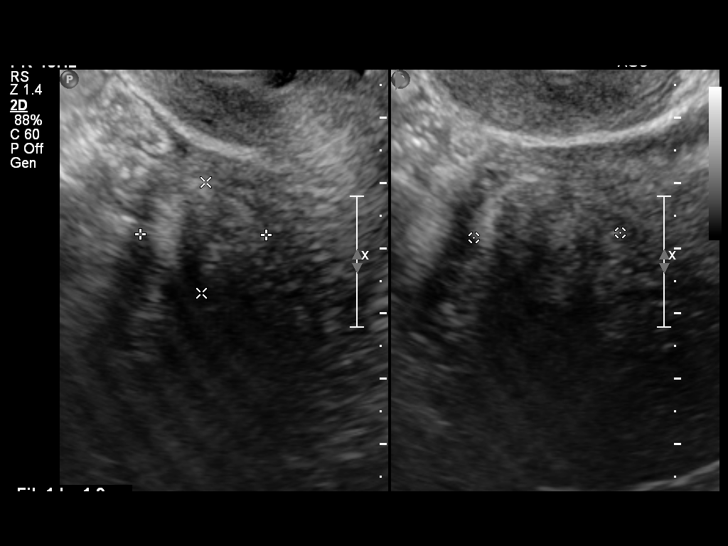
[im 51/61]
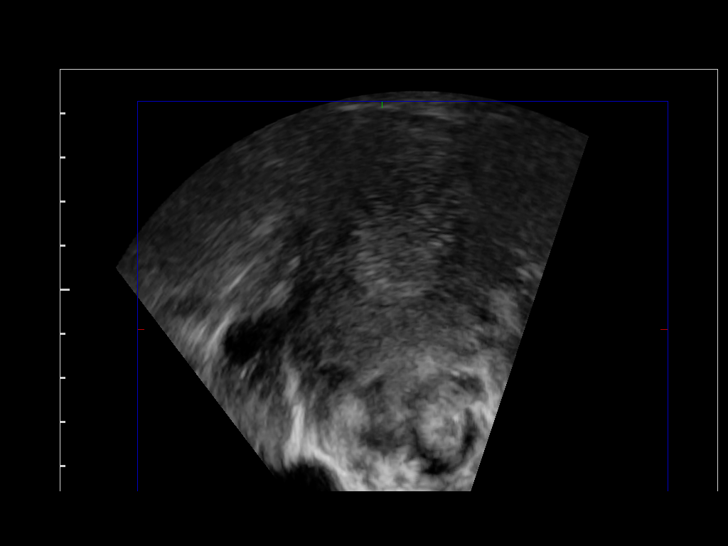
[im 56/61]
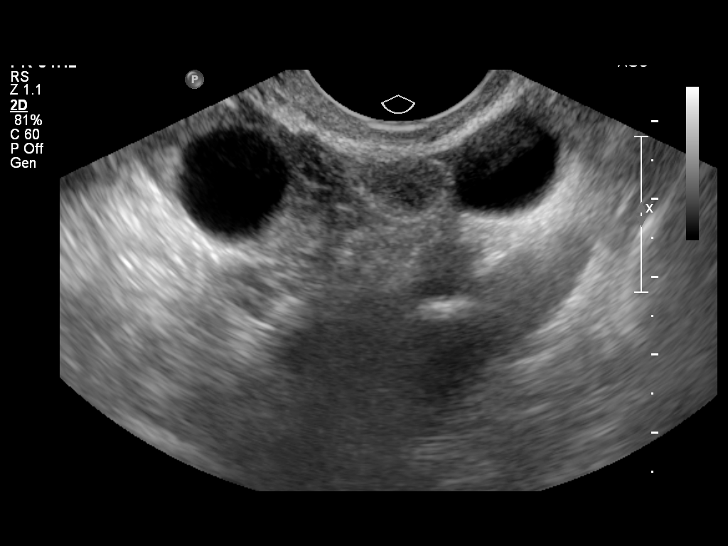
[im 61/61]
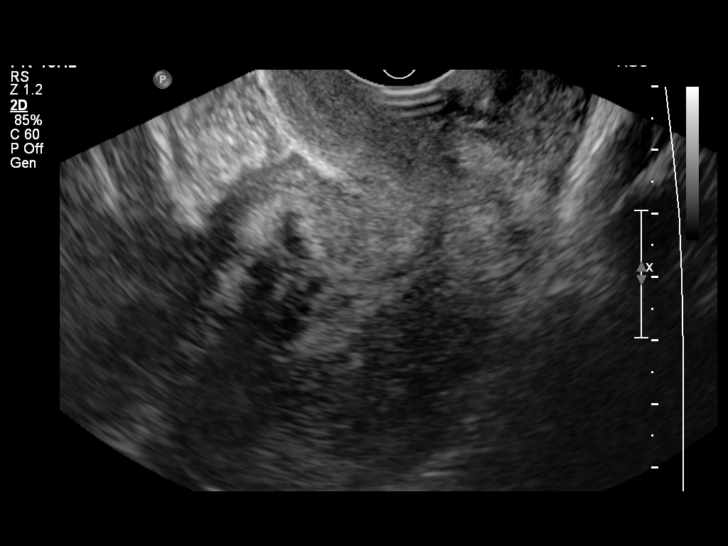

[13 of 25 positions shown; findings below may reference images not displayed]

FINDINGS: Uterus

Measurements: 9.8 x 4.6 x 5.5 cm. 1.9 x 1.7 x 2.2 cm right anterior
intramural fibroid previously measured 1.5 x 1.5 x 2.2 cm..

Endometrium

Thickness: 6.0 mm..  No focal abnormality visualized.

Right ovary

Measurements: 2.6 x 1.7 x 2.0 cm.. In addition to right ovary an
cm follicle/ cyst, there is a 1.7 cm cyst inferior to the right
ovary which may represent a paraovarian cyst.

Left ovary

Measurements: 3.4 x 1.7 x 2.3 cm.. Normal appearance/no adnexal
mass.

Other findings

No free fluid.
IMPRESSION: 1.9 x 1.7 x 2.2 cm right anterior intramural fibroid previously
measured 1.5 x 1.5 x 2.2 cm.

Endometrial thickness within normal limits without focal endometrial
mass identified.

Question 1.7 cm right paraovarian cyst (inferior to the right
ovary).

## 2014-03-23 ENCOUNTER — Emergency Department (HOSPITAL_COMMUNITY): Payer: Self-pay

## 2014-03-23 ENCOUNTER — Encounter (HOSPITAL_COMMUNITY): Payer: Self-pay | Admitting: Emergency Medicine

## 2014-03-23 ENCOUNTER — Emergency Department (HOSPITAL_COMMUNITY)
Admission: EM | Admit: 2014-03-23 | Discharge: 2014-03-24 | Disposition: A | Discharge status: Home / Self Care | Payer: Self-pay | Attending: Emergency Medicine | Admitting: Emergency Medicine

## 2014-03-23 DIAGNOSIS — R10A1 Flank pain, right side: Secondary | ICD-10-CM

## 2014-03-23 DIAGNOSIS — K2990 Gastroduodenitis, unspecified, without bleeding: Principal | ICD-10-CM

## 2014-03-23 DIAGNOSIS — M25519 Pain in unspecified shoulder: Secondary | ICD-10-CM | POA: Insufficient documentation

## 2014-03-23 DIAGNOSIS — Z79899 Other long term (current) drug therapy: Secondary | ICD-10-CM | POA: Insufficient documentation

## 2014-03-23 DIAGNOSIS — R1110 Vomiting, unspecified: Secondary | ICD-10-CM

## 2014-03-23 DIAGNOSIS — K2970 Gastritis, unspecified, without bleeding: Secondary | ICD-10-CM

## 2014-03-23 DIAGNOSIS — R1011 Right upper quadrant pain: Secondary | ICD-10-CM

## 2014-03-23 DIAGNOSIS — R0602 Shortness of breath: Secondary | ICD-10-CM | POA: Insufficient documentation

## 2014-03-23 DIAGNOSIS — Z3202 Encounter for pregnancy test, result negative: Secondary | ICD-10-CM | POA: Insufficient documentation

## 2014-03-23 DIAGNOSIS — R109 Unspecified abdominal pain: Secondary | ICD-10-CM

## 2014-03-23 LAB — COMPREHENSIVE METABOLIC PANEL
ALT: 24 U/L (ref 0–35)
AST: 20 U/L (ref 0–37)
Albumin: 3.7 g/dL (ref 3.5–5.2)
Alkaline Phosphatase: 52 U/L (ref 39–117)
BILIRUBIN TOTAL: 0.5 mg/dL (ref 0.3–1.2)
BUN: 12 mg/dL (ref 6–23)
CHLORIDE: 104 meq/L (ref 96–112)
CO2: 24 mEq/L (ref 19–32)
Calcium: 9.5 mg/dL (ref 8.4–10.5)
Creatinine, Ser: 0.9 mg/dL (ref 0.50–1.10)
GFR calc non Af Amer: 77 mL/min — ABNORMAL LOW (ref >90)
GFR, EST AFRICAN AMERICAN: 89 mL/min — AB (ref >90)
GLUCOSE: 116 mg/dL — AB (ref 70–99)
Potassium: 4 mEq/L (ref 3.7–5.3)
Sodium: 140 mEq/L (ref 137–147)
Total Protein: 7.2 g/dL (ref 6.0–8.3)

## 2014-03-23 LAB — URINALYSIS, ROUTINE W REFLEX MICROSCOPIC
Bilirubin Urine: NEGATIVE
GLUCOSE, UA: NEGATIVE mg/dL
KETONES UR: NEGATIVE mg/dL
Leukocytes, UA: NEGATIVE
Nitrite: NEGATIVE
PROTEIN: NEGATIVE mg/dL
Specific Gravity, Urine: 1.024 (ref 1.005–1.030)
UROBILINOGEN UA: 1 mg/dL (ref 0.0–1.0)
pH: 8.5 — ABNORMAL HIGH (ref 5.0–8.0)

## 2014-03-23 LAB — CBC WITH DIFFERENTIAL/PLATELET
BASOS ABS: 0 10*3/uL (ref 0.0–0.1)
Basophils Relative: 1 % (ref 0–1)
Eosinophils Absolute: 0.1 10*3/uL (ref 0.0–0.7)
Eosinophils Relative: 1 % (ref 0–5)
HCT: 34.4 % — ABNORMAL LOW (ref 36.0–46.0)
HEMOGLOBIN: 11.8 g/dL — AB (ref 12.0–15.0)
LYMPHS ABS: 2.4 10*3/uL (ref 0.7–4.0)
LYMPHS PCT: 50 % — AB (ref 12–46)
MCH: 30.6 pg (ref 26.0–34.0)
MCHC: 34.3 g/dL (ref 30.0–36.0)
MCV: 89.4 fL (ref 78.0–100.0)
Monocytes Absolute: 0.3 10*3/uL (ref 0.1–1.0)
Monocytes Relative: 7 % (ref 3–12)
NEUTROS ABS: 2 10*3/uL (ref 1.7–7.7)
Neutrophils Relative %: 42 % — ABNORMAL LOW (ref 43–77)
Platelets: 228 10*3/uL (ref 150–400)
RBC: 3.85 MIL/uL — AB (ref 3.87–5.11)
RDW: 12.7 % (ref 11.5–15.5)
WBC: 4.8 10*3/uL (ref 4.0–10.5)

## 2014-03-23 LAB — URINE MICROSCOPIC-ADD ON

## 2014-03-23 LAB — LIPASE, BLOOD: Lipase: 18 U/L (ref 11–59)

## 2014-03-23 LAB — POC URINE PREG, ED: Preg Test, Ur: NEGATIVE

## 2014-03-23 MED ORDER — ONDANSETRON HCL 4 MG/2ML IJ SOLN
4.0000 mg | Freq: Once | INTRAMUSCULAR | Status: AC
Start: 2014-03-23 — End: 2014-03-23
  Administered 2014-03-23: 4 mg via INTRAVENOUS
  Filled 2014-03-23: qty 2

## 2014-03-23 MED ORDER — SODIUM CHLORIDE 0.9 % IV BOLUS (SEPSIS)
500.0000 mL | Freq: Once | INTRAVENOUS | Status: AC
  Administered 2014-03-23: 500 mL via INTRAVENOUS

## 2014-03-23 MED ORDER — ONDANSETRON HCL 4 MG/2ML IJ SOLN
4.0000 mg | Freq: Once | INTRAMUSCULAR | Status: AC
  Administered 2014-03-23: 4 mg via INTRAVENOUS
  Filled 2014-03-23: qty 2

## 2014-03-23 MED ORDER — HYDROMORPHONE HCL PF 1 MG/ML IJ SOLN
1.0000 mg | Freq: Once | INTRAMUSCULAR | Status: AC
  Administered 2014-03-23: 1 mg via INTRAVENOUS
  Filled 2014-03-23: qty 1

## 2014-03-23 MED ORDER — MORPHINE SULFATE 2 MG/ML IJ SOLN
2.0000 mg | Freq: Once | INTRAMUSCULAR | Status: AC
  Administered 2014-03-23: 2 mg via INTRAVENOUS
  Filled 2014-03-23: qty 1

## 2014-03-23 NOTE — ED Notes (Signed)
PA at bedside.

## 2014-03-23 NOTE — ED Provider Notes (Signed)
CSN: [id number]     Arrival date & time 03/23/14  1936 History   First MD Initiated Contact with Patient 03/23/14 1949     Chief Complaint  Patient presents with  . Flank Pain     (Consider location/radiation/quality/duration/timing/severity/associated sxs/prior Treatment) Patient is a 43 y.o. female presenting with flank pain. The history is provided by the patient and medical records. No language interpreter was used.  Flank Pain Associated symptoms include abdominal pain and arthralgias (right shoulder). Pertinent negatives include no chest pain, coughing, diaphoresis, fatigue, fever, headaches, nausea, rash or vomiting.    Toni Chang is a 43 y.o. female  with no major medical Hx presents to the Emergency Department complaining of gradual, persistent, progressively worsening RUQ abd pain that radiates to his back and into her right shoulder onset 11am this morning, but acutely worsening 1 hour PTA.  Pt reports that she has not had any N/V, but has had decreased appetite today. Associated symptoms include mild SOB related to the pain.  Nothing makes her symptoms better or worse.  Pt denies fever, chills, headache, neck pain, chest pain, N/V/D, weakness, dizziness, syncope, dysuria, hematuria.  She denies back pain, abd surgeries, leg swelling or Hx of DVT.    Past Medical History  Diagnosis Date  . No pertinent past medical history    Past Surgical History  Procedure Laterality Date  . Shoulder surgery  06/2011    right rotator cuff  . Dilation and curettage of uterus  01/2011   Family History  Problem Relation Age of Onset  . Anesthesia problems Neg Hx   . Diabetes Mother   . Diabetes Father    History  Substance Use Topics  . Smoking status: Never Smoker   . Smokeless tobacco: Not on file  . Alcohol Use: No   OB History   Grav Para Term Preterm Abortions TAB SAB Ect Mult Living   4 4 4  0 0 0 0 0 0 4     Review of Systems  Constitutional: Positive for appetite  change. Negative for fever, diaphoresis, fatigue and unexpected weight change.  HENT: Negative for mouth sores.   Eyes: Negative for visual disturbance.  Respiratory: Negative for cough, chest tightness, shortness of breath and wheezing.   Cardiovascular: Negative for chest pain.  Gastrointestinal: Positive for abdominal pain. Negative for nausea, vomiting, diarrhea and constipation.  Endocrine: Negative for polydipsia, polyphagia and polyuria.  Genitourinary: Positive for flank pain. Negative for dysuria, urgency, frequency and hematuria.  Musculoskeletal: Positive for arthralgias (right shoulder). Negative for back pain and neck stiffness.  Skin: Negative for rash.  Allergic/Immunologic: Negative for immunocompromised state.  Neurological: Negative for syncope, light-headedness and headaches.  Hematological: Does not bruise/bleed easily.  Psychiatric/Behavioral: Negative for sleep disturbance. The patient is not nervous/anxious.       Allergies  Eggs or egg-derived products; Onion; and Tomato  Home Medications   Current Outpatient Rx  Name  Route  Sig  Dispense  Refill  . medroxyPROGESTERone (PROVERA) 10 MG tablet   Oral   Take 1 tablet (10 mg total) by mouth daily.   15 tablet   12   . Multiple Vitamins-Iron (MULTIVITAMINS WITH IRON) TABS tablet   Oral   Take 1 tablet by mouth daily.         Marland Kitchen omega-3 acid ethyl esters (LOVAZA) 1 G capsule   Oral   Take 1 g by mouth daily.         Marland Kitchen HYDROcodone-acetaminophen (NORCO/VICODIN) 5-325  MG per tablet   Oral   Take 1-2 tablets by mouth every 4 (four) hours as needed.   15 tablet   0   . EXPIRED: medroxyPROGESTERone (PROVERA) 10 MG tablet   Oral   Take 1 tablet (10 mg total) by mouth daily.   10 tablet   0   . ondansetron (ZOFRAN ODT) 8 MG disintegrating tablet      8mg  ODT q4 hours prn nausea   12 tablet   0    BP 108/65  Pulse 63  Temp(Src) 98 F (36.7 C) (Oral)  Resp 16  Ht 5\' 3"  (1.6 m)  Wt 185 lb  (83.915 kg)  BMI 32.78 kg/m2  SpO2 100%  LMP 03/23/2014 Physical Exam  Nursing note and vitals reviewed. Constitutional: She is oriented to person, place, and time. She appears well-developed and well-nourished. No distress.  HENT:  Head: Normocephalic and atraumatic.  Mouth/Throat: Oropharynx is clear and moist. No oropharyngeal exudate.  Eyes: Conjunctivae are normal. No scleral icterus.  Neck: Normal range of motion. Neck supple.  Full ROM without pain  Cardiovascular: Normal rate, regular rhythm, normal heart sounds and intact distal pulses.   No murmur heard. No tachycardia  Pulmonary/Chest: Effort normal and breath sounds normal. No respiratory distress. She has no wheezes.  Abdominal: Soft. Bowel sounds are normal. She exhibits no distension and no mass. There is tenderness in the right upper quadrant. There is guarding, CVA tenderness (right) and positive Murphy's sign. There is no rebound.    RUQ abd pain; positive murphy's sign Mild right CVA tenderness, no left CVA tenderness   Musculoskeletal:  Full range of motion of the T-spine and L-spine No tenderness to palpation of the spinous processes of the T-spine or L-spine No tenderness to palpation of the paraspinous muscles of the L-spine  Lymphadenopathy:    She has no cervical adenopathy.  Neurological: She is alert and oriented to person, place, and time. She has normal reflexes.  Speech is clear and goal oriented, follows commands Normal strength in upper and lower extremities bilaterally including dorsiflexion and plantar flexion, strong and equal grip strength Sensation normal to light and sharp touch Moves extremities without ataxia, coordination intact Normal gait Normal balance  Skin: Skin is warm and dry. No rash noted. She is not diaphoretic. No erythema.  Psychiatric: She has a normal mood and affect. Her behavior is normal.    ED Course  Procedures (including critical care time) Labs Review Labs  Reviewed  CBC WITH DIFFERENTIAL - Abnormal; Notable for the following:    RBC 3.85 (*)    Hemoglobin 11.8 (*)    HCT 34.4 (*)    Neutrophils Relative % 42 (*)    Lymphocytes Relative 50 (*)    All other components within normal limits  COMPREHENSIVE METABOLIC PANEL - Abnormal; Notable for the following:    Glucose, Bld 116 (*)    GFR calc non Af Amer 77 (*)    GFR calc Af Amer 89 (*)    All other components within normal limits  URINALYSIS, ROUTINE W REFLEX MICROSCOPIC - Abnormal; Notable for the following:    pH 8.5 (*)    Hgb urine dipstick LARGE (*)    All other components within normal limits  LIPASE, BLOOD  URINE MICROSCOPIC-ADD ON  POC URINE PREG, ED   Imaging Review Ct Abdomen Pelvis Wo Contrast  03/23/2014   CLINICAL DATA:  Right flank pain.  EXAM: CT ABDOMEN AND PELVIS WITHOUT CONTRAST  TECHNIQUE:  Multidetector CT imaging of the abdomen and pelvis was performed following the standard protocol without intravenous contrast.  COMPARISON:  Abdominal ultrasound performed earlier today at 9:01 p.m., and CT of the abdomen and pelvis performed 12/12/2008  FINDINGS: Minimal bibasilar atelectasis is noted.  A vague 1.4 cm hypodensity along the anterior aspect of the left hepatic lobe is grossly stable from 2009 and likely benign. The spleen is unremarkable in appearance. The gallbladder is within normal limits. The pancreas and adrenal glands are unremarkable.  The kidneys are unremarkable in appearance. There is no evidence of hydronephrosis. No renal or ureteral stones are seen. No perinephric stranding is appreciated.  No free fluid is identified. The small bowel is unremarkable in appearance. The stomach is within normal limits. No acute vascular abnormalities are seen.  The appendix is normal in caliber and contains air without evidence for appendicitis. The colon is unremarkable in appearance.  The bladder is mildly distended and grossly unremarkable. The uterus is within normal limits.  The ovaries are relatively symmetric; no suspicious adnexal masses are seen. No inguinal lymphadenopathy is seen.  No acute osseous abnormalities are identified.  IMPRESSION: No acute abnormality seen to explain the patient's symptoms.   Electronically Signed   By: Garald Balding M.D.   On: 03/23/2014 22:55   Dg Chest 2 View  03/23/2014   CLINICAL DATA:  Chest pain and shortness of breath.  EXAM: CHEST  2 VIEW  COMPARISON:  12/09/2011  FINDINGS: The heart size and pulmonary vascularity are normal and the lungs are clear. There is a moderate thoracolumbar scoliosis, stable. No effusions.  IMPRESSION: No acute abnormalities.   Electronically Signed   By: Rozetta Nunnery M.D.   On: 03/23/2014 20:15   US Abdomen Complete  03/23/2014   CLINICAL DATA:  Abdominal pain.  EXAM: ULTRASOUND ABDOMEN COMPLETE  COMPARISON:  None.  FINDINGS: Gallbladder:  No gallstones or wall thickening visualized. No sonographic Murphy sign noted.  Common bile duct:  Diameter: 3.3 mm, normal.  Liver:  No focal lesion identified. Within normal limits in parenchymal echogenicity.  IVC:  Normal.  Pancreas:  Obscured by bowel gas.  Spleen:  Normal.  9.0 cm in length.  Right Kidney:  Length: 9.1 cm. Echogenicity within normal limits. No mass or hydronephrosis visualized.  Left Kidney:  Length: 10.0 cm. Echogenicity within normal limits. No mass or hydronephrosis visualized.  Abdominal aorta:  Normal.  1.9 cm maximal diameter.  Other findings:  None.  IMPRESSION: Normal exam except for nonvisualization of the pancreas.   Electronically Signed   By: Rozetta Nunnery M.D.   On: 03/23/2014 21:51     EKG Interpretation None      MDM   Final diagnoses:  RUQ abdominal pain  Right flank pain  Vomiting  Gastritis   Toni Chang  Presents with right flank and RUQ abd pain.  Pt with positive Murphy's sign and concern for cholecystitis, choledocholithiasis or cholelithiasis.  Will obtain basic labs and abd [place].    10:31 PM Pt pain controlled  completely.  [place] without gallstones or other concerning findings.  Labs reassuring but UA with large Hgb. Will obtain CT to look for renal calculi.    11:42PM CT without acute abnormality including no renal or ureteral stones, appendicitis, diverticulitis or colitis noted  I personally reviewed the imaging tests through PACS system  I reviewed available ER/hospitalization records through the EMR  When I went to discuss this with the patient she was actively vomiting. Will  give small fluid bolus and repeat Zofran.  12:42 AM Patient without further emesis in the department. We'll by mouth trial. Her abdomen remains soft and is no longer tender.   Patient with symptoms consistent with viral gastritis.  Vitals are stable, no fever.  Patient is nontoxic, nonseptic appearing, in no apparent distress.  Patient does not meet the SIRS or Sepsis criteria.  Pt's symptoms have been managed in the department; fluid bolus given.  No signs of dehydration, now tolerating PO fluids > 6 oz.  Lungs are clear.  No focal abdominal pain, no peritoneal signs, no concern for appendicitis, cholecystitis, pancreatitis, ruptured viscus, UTI, kidney stone, PID, ectopic pregnancy or any other abdominal etiology.  Supportive therapy indicated with return if symptoms worsen.  Patient counseled.  I have also discussed reasons to return immediately to the ER.  Patient expresses understanding and agrees with plan.  It has been determined that no acute conditions requiring further emergency intervention are present at this time. The patient/guardian have been advised of the diagnosis and plan. We have discussed signs and symptoms that warrant return to the ED, such as changes or worsening in symptoms.   Vital signs are stable at discharge.   BP 108/65  Pulse 63  Temp(Src) 98 F (36.7 C) (Oral)  Resp 16  Ht 5\' 3"  (1.6 m)  Wt 185 lb (83.915 kg)  BMI 32.78 kg/m2  SpO2 100%  LMP 03/23/2014  Patient/guardian has voiced  understanding and agreed to follow-up with the PCP or specialist.        Abigail Butts, PA-C 03/24/14 [id number]

## 2014-03-23 NOTE — ED Notes (Signed)
Pt is c/o right flank pain that started this morning  Pt states it got worse about 45 minutes ago  Denies nausea or vomiting  Denies urinary sxs

## 2014-03-24 MED ORDER — ONDANSETRON 8 MG PO TBDP: ORAL_TABLET | ORAL | Status: DC

## 2014-03-24 MED ORDER — HYDROCODONE-ACETAMINOPHEN 5-325 MG PO TABS: 1.0000 | ORAL_TABLET | ORAL | Status: DC | PRN

## 2014-03-24 NOTE — Discharge Instructions (Signed)
1. Medications: vicodin for pain, zofran for nausea, usual home medications 2. Treatment: rest, drink plenty of fluids,  3. Follow Up: Please followup with your primary doctor for discussion of your diagnoses and further evaluation after today's visit; if you do not have a primary care doctor use the resource guide provided to find one;     Abdominal Pain, Women Abdominal (stomach, pelvic, or belly) pain can be caused by many things. It is important to tell your doctor:  The location of the pain.  Does it come and go or is it present all the time?  Are there things that start the pain (eating certain foods, exercise)?  Are there other symptoms associated with the pain (fever, nausea, vomiting, diarrhea)? All of this is helpful to know when trying to find the cause of the pain. CAUSES   Stomach: virus or bacteria infection, or ulcer.  Intestine: appendicitis (inflamed appendix), regional ileitis (Crohn's disease), ulcerative colitis (inflamed colon), irritable bowel syndrome, diverticulitis (inflamed diverticulum of the colon), or cancer of the stomach or intestine.  Gallbladder disease or stones in the gallbladder.  Kidney disease, kidney stones, or infection.  Pancreas infection or cancer.  Fibromyalgia (pain disorder).  Diseases of the female organs:  Uterus: fibroid (non-cancerous) tumors or infection.  Fallopian tubes: infection or tubal pregnancy.  Ovary: cysts or tumors.  Pelvic adhesions (scar tissue).  Endometriosis (uterus lining tissue growing in the pelvis and on the pelvic organs).  Pelvic congestion syndrome (female organs filling up with blood just before the menstrual period).  Pain with the menstrual period.  Pain with ovulation (producing an egg).  Pain with an IUD (intrauterine device, birth control) in the uterus.  Cancer of the female organs.  Functional pain (pain not caused by a disease, may improve without treatment).  Psychological  pain.  Depression. DIAGNOSIS  Your doctor will decide the seriousness of your pain by doing an examination.  Blood tests.  X-rays.  Ultrasound.  CT scan (computed tomography, special type of X-ray).  MRI (magnetic resonance imaging).  Cultures, for infection.  Barium enema (dye inserted in the large intestine, to better view it with X-rays).  Colonoscopy (looking in intestine with a lighted tube).  Laparoscopy (minor surgery, looking in abdomen with a lighted tube).  Major abdominal exploratory surgery (looking in abdomen with a large incision). TREATMENT  The treatment will depend on the cause of the pain.   Many cases can be observed and treated at home.  Over-the-counter medicines recommended by your caregiver.  Prescription medicine.  Antibiotics, for infection.  Birth control pills, for painful periods or for ovulation pain.  Hormone treatment, for endometriosis.  Nerve blocking injections.  Physical therapy.  Antidepressants.  Counseling with a psychologist or psychiatrist.  Minor or major surgery. HOME CARE INSTRUCTIONS   Do not take laxatives, unless directed by your caregiver.  Take over-the-counter pain medicine only if ordered by your caregiver. Do not take aspirin because it can cause an upset stomach or bleeding.  Try a clear liquid diet (broth or water) as ordered by your caregiver. Slowly move to a bland diet, as tolerated, if the pain is related to the stomach or intestine.  Have a thermometer and take your temperature several times a day, and record it.  Bed rest and sleep, if it helps the pain.  Avoid sexual intercourse, if it causes pain.  Avoid stressful situations.  Keep your follow-up appointments and tests, as your caregiver orders.  If the pain does not go  away with medicine or surgery, you may try:  Acupuncture.  Relaxation exercises (yoga, meditation).  Group therapy.  Counseling. SEEK MEDICAL CARE IF:   You  notice certain foods cause stomach pain.  Your home care treatment is not helping your pain.  You need stronger pain medicine.  You want your IUD removed.  You feel faint or lightheaded.  You develop nausea and vomiting.  You develop a rash.  You are having side effects or an allergy to your medicine. SEEK IMMEDIATE MEDICAL CARE IF:   Your pain does not go away or gets worse.  You have a fever.  Your pain is felt only in portions of the abdomen. The right side could possibly be appendicitis. The left lower portion of the abdomen could be colitis or diverticulitis.  You are passing blood in your stools (bright red or black tarry stools, with or without vomiting).  You have blood in your urine.  You develop chills, with or without a fever.  You pass out. MAKE SURE YOU:   Understand these instructions.  Will watch your condition.  Will get help right away if you are not doing well or get worse. Document Released: 10/05/2007 Document Revised: 03/01/2012 Document Reviewed: 10/25/2009 [facility] Patient Information 2014 [place], [place].    Emergency Department Resource Guide 1) Find a Doctor and Pay Out of Pocket Although you won't have to find out who is covered by your insurance plan, it is a good idea to ask around and get recommendations. You will then need to call the office and see if the doctor you have chosen will accept you as a new patient and what types of options they offer for patients who are self-pay. Some doctors offer discounts or will set up payment plans for their patients who do not have insurance, but you will need to ask so you aren't surprised when you get to your appointment.  2) Contact Your Local Health Department Not all health departments have doctors that can see patients for sick visits, but many do, so it is worth a call to see if yours does. If you don't know where your local health department is, you can check in your phone book. The CDC also  has a tool to help you locate your state's health department, and many state websites also have listings of all of their local health departments.  3) Find a [facility] If your illness is not likely to be very severe or complicated, you may want to try a walk in clinic. These are popping up all over the country in pharmacies, drugstores, and shopping centers. They're usually staffed by nurse practitioners or physician assistants that have been trained to treat common illnesses and complaints. They're usually fairly quick and inexpensive. However, if you have serious medical issues or chronic medical problems, these are probably not your best option.  No Primary Care Doctor: - Call Health Connect at  [phone] - they can help you locate a primary care doctor that  accepts your insurance, provides certain services, etc. - Physician Referral Service- [phone]  Chronic Pain Problems: Organization         Address  Phone   Notes  [facility]  [phone] Patients need to be referred by their primary care doctor.   Medication Assistance: Organization         Address  Phone   Notes  [facility] Medication Assistance Program [address] [place], [place] [address] [phone]  [phone] --Must be a resident of [facility] -- Must have NO insurance coverage whatsoever (no Medicaid/ Medicare, etc.) -- The pt. MUST have a primary care doctor that directs their care regularly and follows them in the community   MedAssist  [phone]   [employer]  [phone]    Agencies that provide inexpensive medical care: Organization         Address  Phone   Notes  [place]  [phone]   [facility]    [phone]   [facility] [place], [place] [address] [phone]   [facility] [address] [place] [phone]   Planned Parenthood    [phone]    [facility]    [phone]   [place] [address], [place] Phone:  [phone], Fax:  [phone] Hours of Operation:  9 am - 6 pm, M-F.  Also accepts Medicaid/Medicare and self-pay.  [facility] for [place] [place], [address], [place] Phone: [phone], Fax: [phone]. Hours of Operation:  8:30 am - 5:30 pm, M-F.  Also accepts Medicaid and self-pay.  [facility] [place] [address], [place] Phone: [phone]   [place], [place], [place] [phone], Ext. [phone] Mondays & Thursdays: 7-9 AM.  First 15 patients are seen on a first come, first serve basis.    [place] Providers:  Organization         Address  Phone   Notes  [facility] [address], [place] [phone] Also accepts self-pay patients.  [facility] [address], [place]  [phone]   [place], [address], [place] [phone]   [facility] [address] [place] [phone]   Lucianne Lei [address], [place]   [phone] Only accepts [place] Access [place] patients after they have their name applied to their card.   Self-Pay (no insurance) in [facility]:  Organization         Address  Phone   Notes  Sickle Cell Patients, [facility] [place] [phone]   [facility] [place] [phone]   [facility] [place]  [place], [place], [place] [phone]   [facility]/Dr. Osei-Bonsu  [address] [place] or [address], [place] [phone] Phone number for both [place] and [place] locations is the same.  [facility] [address], [place] [phone]   [facility] [address], [place] or [address] [phone]   [facility] [address] [place] [phone], phone; [phone], fax Sees patients 1st and 3rd Saturday of every month.  Must not qualify for public or private insurance (i.e. Medicaid, Medicare, [place] Health Choice, Veterans' Benefits)  Household income should be no more than 200% of the poverty level The clinic cannot treat you if you are pregnant or think you are pregnant  Sexually transmitted diseases are not treated at the clinic.    Dental Care: Organization  Address  Phone  Notes  [facility] [place], [place] [phone] Accepts children up to age 38 who are enrolled in [place] or [employer]; pregnant women with a Medicaid card; and children who have applied for Medicaid or [place] Health Choice, but were declined, whose parents can pay a reduced fee at time of service.  [facility] Department of [facility]  [address], [place] [phone] Accepts children up to age 64 who are enrolled in [place] or [place]; pregnant women with a Medicaid card; and children who have applied for Medicaid or [place] Health Choice, but were declined, whose parents can pay a reduced fee at time of service.  [place] Adult [facility] Access PROGRAM  [place] [phone] Patients are seen by appointment only. Walk-ins are not accepted. [place] will see patients 45 years of age and older. Monday - Tuesday (8am-5pm) Most Wednesdays (8:30-5pm) $30 per visit, cash only  [facility] Adult Dental Access PROGRAM  [address], [facility] [phone] Patients are seen by appointment only. Walk-ins are not accepted. [place] will see patients 68 years of age and older. One Wednesday  Evening (Monthly: Volunteer Based).  $30 per visit, cash only  [place]  [phone] for adults; Children under age 54, call Graduate Pediatric Dentistry at [phone]. Children aged 37-14, please call [phone] to request a pediatric application.  Dental services are provided in all areas of dental care including fillings, crowns and bridges, complete and partial dentures, implants, gum treatment, root canals, and extractions. Preventive care is also provided. Treatment is provided to both adults and children. Patients are selected via a lottery and there is often a waiting list.   [facility] [address], [place]  [phone] [email or web address]   [facility] [address] [place], [place] [phone], Ext. [phone] Second and Fourth Thursday of each month, opens at 6:30 AM; Clinic ends at 9 AM.  Patients are seen on a first-come first-served basis, and a limited number are seen during each clinic.   [facility]  [address] Hillard Danker [place], [place] [phone]   Eligibility Requirements You must have lived in [place], [place], or [place] counties for at least the last three months.   You cannot be eligible for state or federal sponsored [employer], including [employer], [place], or [employer].   You generally cannot be eligible for healthcare insurance through your employer.    How to apply: Eligibility screenings are held every Tuesday and Wednesday afternoon from 1:00 pm until 4:00 pm. You do not need an appointment for the interview!  [facility] [address], [place], [place]   [place]  [place] Department  [place]  [phone]    Behavioral Health Resources in the Community: Intensive Outpatient Programs Organization         Address  Phone  Notes  [place]. [address] [place], [place] [phone]   [facility] [address], [place], [place]   ADS: Alcohol & Drug S[facility]cs [address] [place], [place]   [place] [address]  [place], [place] or [phone]   Substance Abuse Resources  Organization         Address  Phone  Notes  Alcohol and Drug Services  [place]  [phone]   [facility] [place]  [phone]   [place] Pester  [phone]   Residential & Outpatient Substance Abuse Program  [phone]   Psychological Services Organization         Address  Phone  Notes  [facility] [place]  [place]  [phone]   [place] [address], [place] or [phone]    Mobile Crisis Teams Organization         Address  Phone  Notes  [facility], Mobile Crisis Care Unit  [phone]   Assertive Psychotherapeutic Services  [address] [place], [place]   [place] [address], [place] [phone]    Self-Help/Support Groups Organization         Address  Phone             Notes  [place]. of [place] - variety of support groups  [place] Call for more information  Narcotics Anonymous (NA), [facility] [address], [employer] [place]  2 meetings at this location   Special educational needs teacher         Address  Phone  Notes  ASAP Residential Treatment [place],    [place]  [phone]   [facility]  [address] [place] [address], [place], [place]   [place], [place] [phone] Admissions: 8am-3pm M-F  Incentives Substance [place] [address],    [place], [place] [phone]   [facility] [address] [place],  [place], [place]   [facility] [address],  [place], [place]   Insight Programs - Intensive Outpatient [address], [place], [place]   [facility] [place].,  [place], [place] [phone] or [phone]   [facility] Services (RTS) [address] [place], [place] Accepts Medicaid  [facility] [place] [address],  [place] [phone] Substance Abuse/Addiction Treatment   [facility] Organization         Address  Phone  Notes  [facility]  [phone]   Domenic Schwab, PhD [address] [place], [place]   [phone] or [phone]   [place], [place] [phone]   [facility] [address], [place], [place] [phone] Insurance/Medicaid/sponsorship through [facility] Families [address]                                    [place], [place] [phone] [place] [address], [place] [phone]    Dr. Adele Schilder  [phone]   [facility] Dept. 1) [address] [place] 2) [employer] 3)  [place] [address], [place] [phone]  [phone]   [place] [phone] or [phone] (After Hours)

## 2014-03-25 NOTE — ED Provider Notes (Signed)
Medical screening examination/treatment/procedure(s) were performed by non-physician practitioner and as supervising physician I was immediately available for consultation/collaboration.    Kathalene Frames, MD 03/25/14 2122

## 2014-05-15 ENCOUNTER — Emergency Department (HOSPITAL_COMMUNITY): Payer: Self-pay

## 2014-05-15 ENCOUNTER — Emergency Department (HOSPITAL_COMMUNITY)
Admission: EM | Admit: 2014-05-15 | Discharge: 2014-05-15 | Disposition: A | Discharge status: Home / Self Care | Payer: Self-pay | Attending: Emergency Medicine | Admitting: Emergency Medicine

## 2014-05-15 ENCOUNTER — Encounter (HOSPITAL_COMMUNITY): Payer: Self-pay | Admitting: Emergency Medicine

## 2014-05-15 DIAGNOSIS — R350 Frequency of micturition: Secondary | ICD-10-CM | POA: Insufficient documentation

## 2014-05-15 DIAGNOSIS — R109 Unspecified abdominal pain: Secondary | ICD-10-CM | POA: Insufficient documentation

## 2014-05-15 DIAGNOSIS — R0789 Other chest pain: Secondary | ICD-10-CM

## 2014-05-15 DIAGNOSIS — Z3202 Encounter for pregnancy test, result negative: Secondary | ICD-10-CM | POA: Insufficient documentation

## 2014-05-15 DIAGNOSIS — R071 Chest pain on breathing: Secondary | ICD-10-CM | POA: Insufficient documentation

## 2014-05-15 LAB — BASIC METABOLIC PANEL
BUN: 10 mg/dL (ref 6–23)
CO2: 27 meq/L (ref 19–32)
Calcium: 9.5 mg/dL (ref 8.4–10.5)
Chloride: 103 mEq/L (ref 96–112)
Creatinine, Ser: 0.96 mg/dL (ref 0.50–1.10)
GFR calc Af Amer: 83 mL/min — ABNORMAL LOW (ref >90)
GFR calc non Af Amer: 71 mL/min — ABNORMAL LOW (ref >90)
GLUCOSE: 90 mg/dL (ref 70–99)
POTASSIUM: 4 meq/L (ref 3.7–5.3)
Sodium: 138 mEq/L (ref 137–147)

## 2014-05-15 LAB — URINALYSIS, ROUTINE W REFLEX MICROSCOPIC
Bilirubin Urine: NEGATIVE
Glucose, UA: NEGATIVE mg/dL
HGB URINE DIPSTICK: NEGATIVE
Ketones, ur: NEGATIVE mg/dL
LEUKOCYTES UA: NEGATIVE
Nitrite: NEGATIVE
PROTEIN: NEGATIVE mg/dL
Specific Gravity, Urine: 1.03 (ref 1.005–1.030)
Urobilinogen, UA: 0.2 mg/dL (ref 0.0–1.0)
pH: 5 (ref 5.0–8.0)

## 2014-05-15 LAB — CBC
HCT: 36 % (ref 36.0–46.0)
HEMOGLOBIN: 12.2 g/dL (ref 12.0–15.0)
MCH: 30.5 pg (ref 26.0–34.0)
MCHC: 33.9 g/dL (ref 30.0–36.0)
MCV: 90 fL (ref 78.0–100.0)
Platelets: 261 10*3/uL (ref 150–400)
RBC: 4 MIL/uL (ref 3.87–5.11)
RDW: 13 % (ref 11.5–15.5)
WBC: 3.8 10*3/uL — ABNORMAL LOW (ref 4.0–10.5)

## 2014-05-15 LAB — I-STAT TROPONIN, ED: Troponin i, poc: 0 ng/mL (ref 0.00–0.08)

## 2014-05-15 LAB — PRO B NATRIURETIC PEPTIDE: PRO B NATRI PEPTIDE: 106.2 pg/mL (ref 0–125)

## 2014-05-15 LAB — PREGNANCY, URINE: Preg Test, Ur: NEGATIVE

## 2014-05-15 MED ORDER — HYDROCODONE-ACETAMINOPHEN 5-325 MG PO TABS
1.0000 | ORAL_TABLET | Freq: Once | ORAL | Status: AC
Start: 2014-05-15 — End: 2014-05-15
  Administered 2014-05-15: 1 via ORAL
  Filled 2014-05-15: qty 1

## 2014-05-15 MED ORDER — HYDROCODONE-ACETAMINOPHEN 5-325 MG PO TABS: 1.0000 | ORAL_TABLET | ORAL | Status: DC | PRN

## 2014-05-15 MED ORDER — IBUPROFEN 800 MG PO TABS: 800.0000 mg | ORAL_TABLET | Freq: Three times a day (TID) | ORAL | Status: DC | PRN

## 2014-05-15 NOTE — ED Notes (Signed)
Pt out of room for chest x-ray 

## 2014-05-15 NOTE — ED Provider Notes (Signed)
CSN: [id number]     Arrival date & time 05/15/14  1138 History   First MD Initiated Contact with Patient 05/15/14 1149     Chief Complaint  Patient presents with  . Chest Pain     (Consider location/radiation/quality/duration/timing/severity/associated sxs/prior Treatment) The history is provided by the patient.    Pt reports central chest wall pain that began last night while she was lying on the ground playing with her grandchild.  Pain is constant, 8/10 intensity, feels "like I was punched"   Exacerbated by movement and deep inspiration.  She has taken nothing for the pain.  Denies fevers, cough, SOB, leg swelling, recent immobilization, exogenous estrogen, family or personal hx blood clots.  Has been nauseated intermittently x 2 weeks.  Also having urinary frequency. Denies abdominal pain.    Pt in the process of moving.  Doing heavy lifting and packing boxes.   Past Medical History  Diagnosis Date  . No pertinent past medical history    Past Surgical History  Procedure Laterality Date  . Shoulder surgery  06/2011    right rotator cuff  . Dilation and curettage of uterus  01/2011   Family History  Problem Relation Age of Onset  . Anesthesia problems Neg Hx   . Diabetes Mother   . Diabetes Father    History  Substance Use Topics  . Smoking status: Never Smoker   . Smokeless tobacco: Not on file  . Alcohol Use: No   OB History   Grav Para Term Preterm Abortions TAB SAB Ect Mult Living   4 4 4  0 0 0 0 0 0 4     Review of Systems  All other systems reviewed and are negative.     Allergies  Eggs or egg-derived products; Onion; and Tomato  Home Medications   Prior to Admission medications   Medication Sig Start Date End Date Taking? Authorizing Provider  Multiple Vitamins-Iron (MULTIVITAMINS WITH IRON) TABS tablet Take 1 tablet by mouth daily.   Yes Historical Provider, MD   BP 119/67  Pulse 61  Temp(Src) 98.2 F (36.8 C) (Oral)  Resp 18  SpO2 100%  LMP  03/27/2014 Physical Exam  Nursing note and vitals reviewed. Constitutional: She appears well-developed and well-nourished. No distress.  HENT:  Head: Normocephalic and atraumatic.  Neck: Neck supple.  Cardiovascular: Normal rate, regular rhythm and intact distal pulses.   Pulmonary/Chest: Effort normal and breath sounds normal. No respiratory distress. She has no wheezes. She has no rales. She exhibits tenderness.  Abdominal: Soft. She exhibits no distension. There is tenderness in the suprapubic area. There is no rebound and no guarding.  Musculoskeletal: She exhibits no edema.  Neurological: She is alert.  Skin: She is not diaphoretic.    ED Course  Procedures (including critical care time) Labs Review Labs Reviewed  CBC - Abnormal; Notable for the following:    WBC 3.8 (*)    All other components within normal limits  BASIC METABOLIC PANEL - Abnormal; Notable for the following:    GFR calc non Af Amer 71 (*)    GFR calc Af Amer 83 (*)    All other components within normal limits  URINALYSIS, ROUTINE W REFLEX MICROSCOPIC - Abnormal; Notable for the following:    APPearance CLOUDY (*)    All other components within normal limits  PRO B NATRIURETIC PEPTIDE  PREGNANCY, URINE  I-STAT TROPOININ, ED    Imaging Review Dg Chest 2 View  05/15/2014   CLINICAL DATA:  43 year old female with chest pain.  EXAM: CHEST  2 VIEW  COMPARISON:  03/23/2014 and prior chest radiographs dating back to 12/19/2005.  FINDINGS: The cardiomediastinal silhouette is unremarkable.  There is no evidence of focal airspace disease, pulmonary edema, suspicious pulmonary nodule/mass, pleural effusion, or pneumothorax. No acute bony abnormalities are identified.  A mild to moderate thoracic scoliosis is again noted.  IMPRESSION: No active cardiopulmonary disease.   Electronically Signed   By: Hassan Rowan M.D.   On: 05/15/2014 13:16     EKG Interpretation None       Date: 05/15/2014  Rate: 74  Rhythm: normal  sinus rhythm  QRS Axis: normal  Intervals: normal  ST/T Wave abnormalities: nonspecific T wave changes  Conduction Disutrbances:none  Narrative Interpretation: low voltage, abnormal R wave progression  Old EKG Reviewed: unchanged   Filed Vitals:   05/15/14 1145  BP: 119/67  Pulse: 61  Temp: 98.2 F (36.8 C)  Resp: 18    Pain reproduced by engaging pectoral muscles.    MDM   Final diagnoses:  Chest wall pain    Pt with central chest pain that is reproducible with palpation and with engaging the muscles of the chest wall.  Has been doing heavy lifting.  PERC negative.  Doubt PE. Labs, Urine, CXR, EKG unremarkable.  D/C home with ibuprofen, norco, PCP follow up.  Discussed result, findings, treatment, and follow up  with patient.  Pt given return precautions.  Pt verbalizes understanding and agrees with plan.      [place], PA-C 05/15/14 1507

## 2014-05-15 NOTE — ED Provider Notes (Signed)
Medical screening examination/treatment/procedure(s) were performed by non-physician practitioner and as supervising physician I was immediately available for consultation/collaboration.    Dorie Rank, MD 05/15/14 1534

## 2014-05-15 NOTE — Discharge Instructions (Signed)
Read the information below.  Use the prescribed medication as directed.  Please discuss all new medications with your pharmacist.  Do not take additional tylenol while taking the prescribed pain medication to avoid overdose.  You may return to the Emergency Department at any time for worsening condition or any new symptoms that concern you.    You have been diagnosed by your caregiver as having chest wall pain. SEEK IMMEDIATE MEDICAL ATTENTION IF: You develop a fever.  Your chest pains become severe or intolerable.  You develop new, unexplained symptoms (problems).  You develop shortness of breath, nausea, vomiting, sweating or feel light headed.  You develop a new cough or you cough up blood.   Chest Wall Pain Chest wall pain is pain in or around the bones and muscles of your chest. It may take up to 6 weeks to get better. It may take longer if you must stay physically active in your work and activities.  CAUSES  Chest wall pain may happen on its own. However, it may be caused by:  A viral illness like the flu.  Injury.  Coughing.  Exercise.  Arthritis.  Fibromyalgia.  Shingles. HOME CARE INSTRUCTIONS   Avoid overtiring physical activity. Try not to strain or perform activities that cause pain. This includes any activities using your chest or your abdominal and side muscles, especially if heavy weights are used.  Put ice on the sore area.  Put ice in a plastic bag.  Place a towel between your skin and the bag.  Leave the ice on for 15-20 minutes per hour while awake for the first 2 days.  Only take over-the-counter or prescription medicines for pain, discomfort, or fever as directed by your caregiver. SEEK IMMEDIATE MEDICAL CARE IF:   Your pain increases, or you are very uncomfortable.  You have a fever.  Your chest pain becomes worse.  You have new, unexplained symptoms.  You have nausea or vomiting.  You feel sweaty or lightheaded.  You have a cough with  phlegm (sputum), or you cough up blood. MAKE SURE YOU:   Understand these instructions.  Will watch your condition.  Will get help right away if you are not doing well or get worse. Document Released: 12/08/2005 Document Revised: 03/01/2012 Document Reviewed: 08/04/2011 [facility] Patient Information 2014 [place], [place].

## 2014-05-15 NOTE — ED Notes (Signed)
Pt c/o centralized chest pain that started last night, pt states that it hurts when she takes a deep breathe or when she does anything that causes her to move chest. Denis SOB, n/v

## 2014-10-12 ENCOUNTER — Emergency Department (HOSPITAL_COMMUNITY): Payer: Self-pay

## 2014-10-12 ENCOUNTER — Encounter (HOSPITAL_COMMUNITY): Payer: Self-pay | Admitting: Emergency Medicine

## 2014-10-12 ENCOUNTER — Emergency Department (HOSPITAL_COMMUNITY)
Admission: EM | Admit: 2014-10-12 | Discharge: 2014-10-12 | Disposition: A | Discharge status: Home / Self Care | Payer: Self-pay | Attending: Emergency Medicine | Admitting: Emergency Medicine

## 2014-10-12 DIAGNOSIS — Z9889 Other specified postprocedural states: Secondary | ICD-10-CM | POA: Insufficient documentation

## 2014-10-12 DIAGNOSIS — N760 Acute vaginitis: Secondary | ICD-10-CM | POA: Insufficient documentation

## 2014-10-12 DIAGNOSIS — N898 Other specified noninflammatory disorders of vagina: Secondary | ICD-10-CM

## 2014-10-12 DIAGNOSIS — Z79899 Other long term (current) drug therapy: Secondary | ICD-10-CM | POA: Insufficient documentation

## 2014-10-12 DIAGNOSIS — Z3202 Encounter for pregnancy test, result negative: Secondary | ICD-10-CM | POA: Insufficient documentation

## 2014-10-12 DIAGNOSIS — B9689 Other specified bacterial agents as the cause of diseases classified elsewhere: Secondary | ICD-10-CM

## 2014-10-12 DIAGNOSIS — R102 Pelvic and perineal pain: Secondary | ICD-10-CM

## 2014-10-12 DIAGNOSIS — D259 Leiomyoma of uterus, unspecified: Secondary | ICD-10-CM | POA: Insufficient documentation

## 2014-10-12 LAB — COMPREHENSIVE METABOLIC PANEL
ALBUMIN: 3.6 g/dL (ref 3.5–5.2)
ALK PHOS: 66 U/L (ref 39–117)
ALT: 25 U/L (ref 0–35)
ANION GAP: 10 (ref 5–15)
AST: 21 U/L (ref 0–37)
BUN: 8 mg/dL (ref 6–23)
CO2: 26 mEq/L (ref 19–32)
Calcium: 9.2 mg/dL (ref 8.4–10.5)
Chloride: 103 mEq/L (ref 96–112)
Creatinine, Ser: 0.95 mg/dL (ref 0.50–1.10)
GFR calc Af Amer: 83 mL/min — ABNORMAL LOW (ref >90)
GFR calc non Af Amer: 72 mL/min — ABNORMAL LOW (ref >90)
Glucose, Bld: 90 mg/dL (ref 70–99)
POTASSIUM: 4.4 meq/L (ref 3.7–5.3)
SODIUM: 139 meq/L (ref 137–147)
TOTAL PROTEIN: 7 g/dL (ref 6.0–8.3)
Total Bilirubin: 0.8 mg/dL (ref 0.3–1.2)

## 2014-10-12 LAB — LIPASE, BLOOD: Lipase: 15 U/L (ref 11–59)

## 2014-10-12 LAB — CBC WITH DIFFERENTIAL/PLATELET
Basophils Absolute: 0 K/uL (ref 0.0–0.1)
Basophils Relative: 0 % (ref 0–1)
Eosinophils Absolute: 0.1 K/uL (ref 0.0–0.7)
Eosinophils Relative: 1 % (ref 0–5)
HCT: 36.1 % (ref 36.0–46.0)
Hemoglobin: 12.4 g/dL (ref 12.0–15.0)
Lymphocytes Relative: 43 % (ref 12–46)
Lymphs Abs: 2 K/uL (ref 0.7–4.0)
MCH: 31.1 pg (ref 26.0–34.0)
MCHC: 34.3 g/dL (ref 30.0–36.0)
MCV: 90.5 fL (ref 78.0–100.0)
Monocytes Absolute: 0.3 K/uL (ref 0.1–1.0)
Monocytes Relative: 6 % (ref 3–12)
Neutro Abs: 2.4 K/uL (ref 1.7–7.7)
Neutrophils Relative %: 50 % (ref 43–77)
Platelets: 238 K/uL (ref 150–400)
RBC: 3.99 MIL/uL (ref 3.87–5.11)
RDW: 12.4 % (ref 11.5–15.5)
WBC: 4.8 K/uL (ref 4.0–10.5)

## 2014-10-12 LAB — POC URINE PREG, ED: Preg Test, Ur: NEGATIVE

## 2014-10-12 LAB — WET PREP, GENITAL
Trich, Wet Prep: NONE SEEN
WBC, Wet Prep HPF POC: NONE SEEN
Yeast Wet Prep HPF POC: NONE SEEN

## 2014-10-12 LAB — URINALYSIS, ROUTINE W REFLEX MICROSCOPIC
Bilirubin Urine: NEGATIVE
Glucose, UA: NEGATIVE mg/dL
Hgb urine dipstick: NEGATIVE
Ketones, ur: NEGATIVE mg/dL
Leukocytes, UA: NEGATIVE
Nitrite: NEGATIVE
Protein, ur: NEGATIVE mg/dL
Specific Gravity, Urine: 1.021 (ref 1.005–1.030)
Urobilinogen, UA: 0.2 mg/dL (ref 0.0–1.0)
pH: 5 (ref 5.0–8.0)

## 2014-10-12 LAB — HIV ANTIBODY (ROUTINE TESTING W REFLEX): HIV 1&2 Ab, 4th Generation: NONREACTIVE

## 2014-10-12 MED ORDER — METRONIDAZOLE 500 MG PO TABS: 500.0000 mg | ORAL_TABLET | Freq: Two times a day (BID) | ORAL | Status: DC

## 2014-10-12 NOTE — ED Provider Notes (Signed)
CSN: [id number]     Arrival date & time 10/12/14  1150 History   First MD Initiated Contact with Patient 10/12/14 1504     Chief Complaint  Patient presents with  . Abdominal Pain  . Vaginal Odor      (Consider location/radiation/quality/duration/timing/severity/associated sxs/prior Treatment) The history is provided by the patient. No language interpreter was used.  Toni Chang is a 43 y/o F with PMhx of dilation and currettage presenting to the ED with abdominal pain that has been ongoing since Monday, x 4 days. Patient reported that the pain is localized to the lower quadrants - LLQ, RLQ and suprapubic region described as a constant dull, aching pain with mild radiation towards her back. Patient reported that she noticed vaginal discharge starting one week ago - stated that it is of a milky consistency with an odor that is not normally hers. Patient reported that she has been taking over the counter medications that has not helped - stated that the vaginal discharge continues. Stated that she is sexually active with her husband- denied protection of birth control. Stated that her LMP was last month, reported that she gets it regularly. Denied fever, chills, neck pain, neck stiffness, chest pain, shortness of breath, difficulty breathing, dysuria, hematuria, melena , hematochezia, nausea, vomiting, diarrhea, rashes, vaginal pruritis.  PCP none OBGYN none   Past Medical History  Diagnosis Date  . No pertinent past medical history    Past Surgical History  Procedure Laterality Date  . Shoulder surgery  06/2011    right rotator cuff  . Dilation and curettage of uterus  01/2011   Family History  Problem Relation Age of Onset  . Anesthesia problems Neg Hx   . Diabetes Mother   . Diabetes Father    History  Substance Use Topics  . Smoking status: Never Smoker   . Smokeless tobacco: Not on file  . Alcohol Use: No   OB History   Grav Para Term Preterm Abortions TAB SAB Ect Mult  Living   4 4 4  0 0 0 0 0 0 4     Review of Systems  Constitutional: Negative for fever and chills.  Respiratory: Negative for chest tightness and shortness of breath.   Cardiovascular: Negative for chest pain.  Gastrointestinal: Positive for abdominal pain. Negative for nausea, vomiting, diarrhea, constipation, blood in stool and anal bleeding.  Genitourinary: Positive for vaginal discharge. Negative for dysuria, hematuria, vaginal bleeding, vaginal pain and pelvic pain.  Neurological: Negative for dizziness, weakness and headaches.      Allergies  Eggs or egg-derived products; Onion; and Tomato  Home Medications   Prior to Admission medications   Medication Sig Start Date End Date Taking? Authorizing Provider  ibuprofen (ADVIL,MOTRIN) 800 MG tablet Take 1 tablet (800 mg total) by mouth every 8 (eight) hours as needed for mild pain or moderate pain. 05/15/14  Yes Clayton Bibles, PA-C  Multiple Vitamins-Iron (MULTIVITAMINS WITH IRON) TABS tablet Take 1 tablet by mouth daily.   Yes Historical Provider, MD  metroNIDAZOLE (FLAGYL) 500 MG tablet Take 1 tablet (500 mg total) by mouth 2 (two) times daily. 10/12/14   Paizleigh Wilds, PA-C   BP 98/65  Pulse 52  Temp(Src) 98.2 F (36.8 C) (Oral)  Resp 16  SpO2 99%  LMP 08/26/2014 Physical Exam  Nursing note and vitals reviewed. Constitutional: She is oriented to person, place, and time. She appears well-developed and well-nourished. No distress.  HENT:  Head: Normocephalic and atraumatic.  Eyes: Conjunctivae  and EOM are normal. Pupils are equal, round, and reactive to light. Right eye exhibits no discharge. Left eye exhibits no discharge.  Neck: Normal range of motion. Neck supple. No tracheal deviation present.  Cardiovascular: Normal rate, regular rhythm and normal heart sounds.  Exam reveals no friction rub.   No murmur heard. Pulses:      Radial pulses are 2+ on the right side, and 2+ on the left side.       Dorsalis pedis pulses are  2+ on the right side, and 2+ on the left side.  Pulmonary/Chest: Effort normal and breath sounds normal. No respiratory distress. She has no wheezes. She has no rales.  Abdominal: Soft. Bowel sounds are normal. She exhibits no distension. There is tenderness. There is no rebound and no guarding.  Obese Negative abdominal distension BS normoactive i all 4 qaudrants  Abdomen soft upon palpation  Negative peritoneal signs Negative rigidity or guarding noted  Genitourinary: Vaginal discharge found.  Pelvic Exam: Negative swelling, erythema, inflammation, lesions, sores, deformities noted to the external genitalia and vaginal canal. Negative masses or polyps noted. Negative blood in the vaginal vault. Thick white discharge noted with fishy-odor. Negate friability noted to the cervix. Negative CMT or bilateral adnexal tenderness.  Exam chaperoned with tech.  Musculoskeletal: Normal range of motion.  Lymphadenopathy:    She has no cervical adenopathy.  Neurological: She is alert and oriented to person, place, and time. No cranial nerve deficit. She exhibits normal muscle tone. Coordination normal.  Skin: Skin is warm and dry. No rash noted. She is not diaphoretic. No erythema.  Psychiatric: She has a normal mood and affect. Her behavior is normal. Thought content normal.    ED Course  Procedures (including critical care time)  Results for orders placed during the hospital encounter of 10/12/14  WET PREP, GENITAL      Result Value Ref Range   Yeast Wet Prep HPF POC NONE SEEN  NONE SEEN   Trich, Wet Prep NONE SEEN  NONE SEEN   Clue Cells Wet Prep HPF POC FEW (*) NONE SEEN   WBC, Wet Prep HPF POC NONE SEEN  NONE SEEN  CBC WITH DIFFERENTIAL      Result Value Ref Range   WBC 4.8  4.0 - 10.5 K/uL   RBC 3.99  3.87 - 5.11 MIL/uL   Hemoglobin 12.4  12.0 - 15.0 g/dL   HCT 36.1  36.0 - 46.0 %   MCV 90.5  78.0 - 100.0 fL   MCH 31.1  26.0 - 34.0 pg   MCHC 34.3  30.0 - 36.0 g/dL   RDW 12.4  11.5 -  15.5 %   Platelets 238  150 - 400 K/uL   Neutrophils Relative % 50  43 - 77 %   Neutro Abs 2.4  1.7 - 7.7 K/uL   Lymphocytes Relative 43  12 - 46 %   Lymphs Abs 2.0  0.7 - 4.0 K/uL   Monocytes Relative 6  3 - 12 %   Monocytes Absolute 0.3  0.1 - 1.0 K/uL   Eosinophils Relative 1  0 - 5 %   Eosinophils Absolute 0.1  0.0 - 0.7 K/uL   Basophils Relative 0  0 - 1 %   Basophils Absolute 0.0  0.0 - 0.1 K/uL  COMPREHENSIVE METABOLIC PANEL      Result Value Ref Range   Sodium 139  137 - 147 mEq/L   Potassium 4.4  3.7 - 5.3 mEq/L  Chloride 103  96 - 112 mEq/L   CO2 26  19 - 32 mEq/L   Glucose, Bld 90  70 - 99 mg/dL   BUN 8  6 - 23 mg/dL   Creatinine, Ser 0.95  0.50 - 1.10 mg/dL   Calcium 9.2  8.4 - 10.5 mg/dL   Total Protein 7.0  6.0 - 8.3 g/dL   Albumin 3.6  3.5 - 5.2 g/dL   AST 21  0 - 37 U/L   ALT 25  0 - 35 U/L   Alkaline Phosphatase 66  39 - 117 U/L   Total Bilirubin 0.8  0.3 - 1.2 mg/dL   GFR calc non Af Amer 72 (*) >90 mL/min   GFR calc Af Amer 83 (*) >90 mL/min   Anion gap 10  5 - 15  LIPASE, BLOOD      Result Value Ref Range   Lipase 15  11 - 59 U/L  URINALYSIS, ROUTINE W REFLEX MICROSCOPIC      Result Value Ref Range   Color, Urine YELLOW  YELLOW   APPearance CLEAR  CLEAR   Specific Gravity, Urine 1.021  1.005 - 1.030   pH 5.0  5.0 - 8.0   Glucose, UA NEGATIVE  NEGATIVE mg/dL   Hgb urine dipstick NEGATIVE  NEGATIVE   Bilirubin Urine NEGATIVE  NEGATIVE   Ketones, ur NEGATIVE  NEGATIVE mg/dL   Protein, ur NEGATIVE  NEGATIVE mg/dL   Urobilinogen, UA 0.2  0.0 - 1.0 mg/dL   Nitrite NEGATIVE  NEGATIVE   Leukocytes, UA NEGATIVE  NEGATIVE  HIV ANTIBODY (ROUTINE TESTING)      Result Value Ref Range   HIV 1&2 Ab, 4th Generation NONREACTIVE  NONREACTIVE  POC URINE PREG, ED      Result Value Ref Range   Preg Test, Ur NEGATIVE  NEGATIVE    Labs Review Labs Reviewed  WET PREP, GENITAL - Abnormal; Notable for the following:    Clue Cells Wet Prep HPF POC FEW (*)    All  other components within normal limits  COMPREHENSIVE METABOLIC PANEL - Abnormal; Notable for the following:    GFR calc non Af Amer 72 (*)    GFR calc Af Amer 83 (*)    All other components within normal limits  WET PREP, GENITAL  GC/CHLAMYDIA PROBE AMP  GC/CHLAMYDIA PROBE AMP  CBC WITH DIFFERENTIAL  LIPASE, BLOOD  URINALYSIS, ROUTINE W REFLEX MICROSCOPIC  HIV ANTIBODY (ROUTINE TESTING)  POC URINE PREG, ED    Imaging Review US Transvaginal Non-ob  10/12/2014   CLINICAL DATA:  Pelvic pain and vaginal odor.  EXAM: TRANSABDOMINAL AND TRANSVAGINAL ULTRASOUND OF PELVIS  TECHNIQUE: Both transabdominal and transvaginal ultrasound examinations of the pelvis were performed. Transabdominal technique was performed for global imaging of the pelvis including uterus, ovaries, adnexal regions, and pelvic cul-de-sac. It was necessary to proceed with endovaginal exam following the transabdominal exam to visualize the ovaries and uterus in better detail.  COMPARISON:  Pelvis CT dated 03/23/2014 and pelvic ultrasound dated 02/21/2014.  FINDINGS: Uterus  Measurements: 10.5 x 6.0 x 5.1 cm. 2.8 x 2.5 x 2.0 cm oval myometrial mass in the fundus anteriorly on the right. This previously measured 2.2 x 1.9 x 1.7 cm. No submucosal component seen.  Endometrium  Thickness: 5.6 mm.  No focal abnormality visualized.  Right ovary  Measurements: 3.6 x 2.8 x 1.7 cm. 1.6 cm follicle. Normal appearing internal blood flow with color Doppler.  Left ovary  Measurements: 4.3 x 3.8  x 2.1 cm. 2.6 m follicle. Normal appearing internal blood flow with color Doppler.  Other findings  No free fluid.  IMPRESSION: Interval mild increase in size of the previously demonstrated fundal uterine fibroid. This currently measures 2.8 cm in maximum diameter. Otherwise, normal examination.   Electronically Signed   By: Enrique Sack M.D.   On: 10/12/2014 19:24   US Pelvis Complete  10/12/2014   CLINICAL DATA:  Pelvic pain and vaginal odor.  EXAM:  TRANSABDOMINAL AND TRANSVAGINAL ULTRASOUND OF PELVIS  TECHNIQUE: Both transabdominal and transvaginal ultrasound examinations of the pelvis were performed. Transabdominal technique was performed for global imaging of the pelvis including uterus, ovaries, adnexal regions, and pelvic cul-de-sac. It was necessary to proceed with endovaginal exam following the transabdominal exam to visualize the ovaries and uterus in better detail.  COMPARISON:  Pelvis CT dated 03/23/2014 and pelvic ultrasound dated 02/21/2014.  FINDINGS: Uterus  Measurements: 10.5 x 6.0 x 5.1 cm. 2.8 x 2.5 x 2.0 cm oval myometrial mass in the fundus anteriorly on the right. This previously measured 2.2 x 1.9 x 1.7 cm. No submucosal component seen.  Endometrium  Thickness: 5.6 mm.  No focal abnormality visualized.  Right ovary  Measurements: 3.6 x 2.8 x 1.7 cm. 1.6 cm follicle. Normal appearing internal blood flow with color Doppler.  Left ovary  Measurements: 4.3 x 3.8 x 2.1 cm. 2.6 m follicle. Normal appearing internal blood flow with color Doppler.  Other findings  No free fluid.  IMPRESSION: Interval mild increase in size of the previously demonstrated fundal uterine fibroid. This currently measures 2.8 cm in maximum diameter. Otherwise, normal examination.   Electronically Signed   By: Enrique Sack M.D.   On: 10/12/2014 19:24     EKG Interpretation None      MDM   Final diagnoses:  Pelvic pain in female  BV (bacterial vaginosis)  Uterine leiomyoma, unspecified location    Medications - No data to display  Filed Vitals:   10/12/14 1156 10/12/14 1443 10/12/14 1756  BP: 119/64 111/65 98/65  Pulse: 72 64 52  Temp: 98.5 F (36.9 C) 97.9 F (36.6 C) 98.2 F (36.8 C)  TempSrc: Oral Oral   Resp: 16 20 16   SpO2: 98% 100% 99%   CBC unremarkable - negative elevated WBC. CMP unremarkable. Lipase negative elevation. Urine pregnancy negative. UA negative for infection. Wet prep noted few clue cells - negative trich noted. HIV  NONREACTIVE. GC/Chlamydia pending. Pelvic ultrasound noted interval mild increase in the fundal uterine fibroid-otherwise unremarkable findings. Doubt ovarian torsion. Doubt TOA. Doubt pelvic inflammatory disease. Doubt pyelonephritis or UTI. Doubt pancreatitis. Abdominal exam unremarkable-doubt acute abdominal processes. Suspicion of pain secondary to vaginal infection-BV. Patient stable, afebrile. Patient not septic appearing. Discharged patient. Discharge patient with Flagyl. Discussed patient to rest and stay hydrated. [facility]. Discussed with patient that uterine fibroids need to be monitored in size. Discussed with patient to closely monitor symptoms and if symptoms are to worsen or change to report back to the ED - strict return instructions given.  Patient agreed to plan of care, understood, all questions answered.   Jamse Mead, PA-C 10/12/14 2058

## 2014-10-12 NOTE — ED Notes (Signed)
Pt states that she was taking OTC yeast infection for a self diagnosed yeast infection. States that on Monday, she began having low abd pain with vaginal odor.  Denies NVD.

## 2014-10-12 NOTE — Discharge Instructions (Signed)
Please call your doctor for a followup appointment within 24-48 hours. When you talk to your doctor please let them know that you were seen in the emergency department and have them acquire all of your records so that they can discuss the findings with you and formulate a treatment plan to fully care for your new and ongoing problems. Please call and set-up an appointment with your primary care provider Please call Uterine Fibroid A uterine fibroid is a growth (tumor) that occurs in your uterus. This type of tumor is not cancerous and does not spread out of the uterus. You can have one or many fibroids. Fibroids can vary in size, weight, and where they grow in the uterus. Some can become quite large. Most fibroids do not require medical treatment, but some can cause pain or heavy bleeding during and between periods. CAUSES  A fibroid is the result of a single uterine cell that keeps growing (unregulated), which is different than most cells in the human body. Most cells have a control mechanism that keeps them from reproducing without control.  SIGNS AND SYMPTOMS  Bleeding. Pelvic pain and pressure. Bladder problems due to the size of the fibroid. Infertility and miscarriages depending on the size and location of the fibroid. DIAGNOSIS  Uterine fibroids are diagnosed through a physical exam. Your health care provider may feel the lumpy tumors during a pelvic exam. Ultrasonography may be done to get information regarding size, location, and number of tumors.  TREATMENT  Your health care provider may recommend watchful waiting. This involves getting the fibroid checked by your health care provider to see if it grows or shrinks.  Hormone treatment or an intrauterine device (IUD) may be prescribed.  Surgery may be needed to remove the fibroids (myomectomy) or the uterus (hysterectomy). This depends on your situation. When fibroids interfere with fertility and a woman wants to become pregnant, a health  care provider may recommend having the fibroids removed.  Ila care depends on how you were treated. In general:  Keep all follow-up appointments with your health care provider.  Only take over-the-counter or prescription medicines as directed by your health care provider. If you were prescribed a hormone treatment, take the hormone medicines exactly as directed. Do not take aspirin. It can cause bleeding.  Talk to your health care provider about taking iron pills. If your periods are troublesome but not so heavy, lie down with your feet raised slightly above your heart. Place cold packs on your lower abdomen.  If your periods are heavy, write down the number of pads or tampons you use per month. Bring this information to your health care provider.  Include green vegetables in your diet.  SEEK IMMEDIATE MEDICAL CARE IF: You have pelvic pain or cramps not controlled with medicines.  You have a sudden increase in pelvic pain.  You have an increase in bleeding between and during periods.  You have excessive periods and soak tampons or pads in a half hour or less. You feel lightheaded or have fainting episodes. Document Released: 12/05/2000 Document Revised: 09/28/2013 Document Reviewed: 07/07/2013 [facility] Patient Information 2015 [place], [place]. This information is not intended to replace advice given to you by your health care provider. Make sure you discuss any questions you have with your health care provider. and set-up an appointment with OBGYN  Please rest and stay hydrated Please take medications as prescribed and on a full stomach  Please continue to monitor symptoms closely and if  symptoms are to worsen or change (fever greater than 101, chills, sweating, nausea, vomiting, chest pain, shortness of breathe, difficulty breathing, weakness, numbness, tingling, worsening or changes to pain pattern, blood in the stools, black tarry stools, changes to vaginal  discharge) please report back to the Emergency Department immediately.   Bacterial Vaginosis Bacterial vaginosis is a vaginal infection that occurs when the normal balance of bacteria in the vagina is disrupted. It results from an overgrowth of certain bacteria. This is the most common vaginal infection in women of childbearing age. Treatment is important to prevent complications, especially in pregnant women, as it can cause a premature delivery. CAUSES  Bacterial vaginosis is caused by an increase in harmful bacteria that are normally present in smaller amounts in the vagina. Several different kinds of bacteria can cause bacterial vaginosis. However, the reason that the condition develops is not fully understood. RISK FACTORS Certain activities or behaviors can put you at an increased risk of developing bacterial vaginosis, including:  Having a new sex partner or multiple sex partners.  Douching.  Using an intrauterine device (IUD) for contraception. Women do not get bacterial vaginosis from toilet seats, bedding, swimming pools, or contact with objects around them. SIGNS AND SYMPTOMS  Some women with bacterial vaginosis have no signs or symptoms. Common symptoms include:  Grey vaginal discharge.  A fishlike odor with discharge, especially after sexual intercourse.  Itching or burning of the vagina and vulva.  Burning or pain with urination. DIAGNOSIS  Your health care provider will take a medical history and examine the vagina for signs of bacterial vaginosis. A sample of vaginal fluid may be taken. Your health care provider will look at this sample under a microscope to check for bacteria and abnormal cells. A vaginal pH test may also be done.  TREATMENT  Bacterial vaginosis may be treated with antibiotic medicines. These may be given in the form of a pill or a vaginal cream. A second round of antibiotics may be prescribed if the condition comes back after treatment.  HOME CARE  INSTRUCTIONS   Only take over-the-counter or prescription medicines as directed by your health care provider.  If antibiotic medicine was prescribed, take it as directed. Make sure you finish it even if you start to feel better.  Do not have sex until treatment is completed.  Tell all sexual partners that you have a vaginal infection. They should see their health care provider and be treated if they have problems, such as a mild rash or itching.  Practice safe sex by using condoms and only having one sex partner. SEEK MEDICAL CARE IF:   Your symptoms are not improving after 3 days of treatment.  You have increased discharge or pain.  You have a fever. MAKE SURE YOU:   Understand these instructions.  Will watch your condition.  Will get help right away if you are not doing well or get worse. FOR MORE INFORMATION  Centers for Disease Control and Prevention, Division of STD Prevention: AppraiserFraud.fi American Sexual Health Association (ASHA): www.ashastd.org  Document Released: 12/08/2005 Document Revised: 09/28/2013 Document Reviewed: 07/20/2013 [facility] Patient Information 2015 [place], [place]. This information is not intended to replace advice given to you by your health care provider. Make sure you discuss any questions you have with your health care provider.   Emergency Department Resource Guide 1) Find a Doctor and Pay Out of Pocket Although you won't have to find out who is covered by your insurance plan, it is a good  idea to ask around and get recommendations. You will then need to call the office and see if the doctor you have chosen will accept you as a new patient and what types of options they offer for patients who are self-pay. Some doctors offer discounts or will set up payment plans for their patients who do not have insurance, but you will need to ask so you aren't surprised when you get to your appointment.  2) Contact Your Local Health Department Not all health  departments have doctors that can see patients for sick visits, but many do, so it is worth a call to see if yours does. If you don't know where your local health department is, you can check in your phone book. The CDC also has a tool to help you locate your state's health department, and many state websites also have listings of all of their local health departments.  3) Find a [facility] If your illness is not likely to be very severe or complicated, you may want to try a walk in clinic. These are popping up all over the country in pharmacies, drugstores, and shopping centers. They're usually staffed by nurse practitioners or physician assistants that have been trained to treat common illnesses and complaints. They're usually fairly quick and inexpensive. However, if you have serious medical issues or chronic medical problems, these are probably not your best option.  No Primary Care Doctor: - Call Health Connect at  [phone] - they can help you locate a primary care doctor that  accepts your insurance, provides certain services, etc. - Physician Referral Service- [phone]  Chronic Pain Problems: Organization         Address  Phone   Notes  [facility]  [phone] Patients need to be referred by their primary care doctor.   Medication Assistance: Organization         Address  Phone   Notes  [facility] Medication [facility] [address] [place], [place] [address] [phone] --Must be a resident of [facility] -- Must have NO insurance coverage whatsoever (no Medicaid/ Medicare, etc.) -- The pt. MUST have a primary care doctor that directs their care regularly and follows them in the community   MedAssist  [phone]   [employer]  [phone]    Agencies that provide inexpensive medical care: Organization         Address  Phone   Notes  [place]  [phone]   [facility]     [phone]   [facility] [place], [place] [address] [phone]   [place] [address], [place] [phone]   Planned Parenthood    [phone]   [facility]    [phone]   [place] and [place] [address], [place] Phone:  [phone], Fax:  [phone] Hours of Operation:  9 am - 6 pm, M-F.  Also accepts Medicaid/Medicare and self-pay.  [facility] for [place], [address],  Phone: [phone], Fax: [phone]. Hours of Operation:  8:30 am - 5:30 pm, M-F.  Also accepts Medicaid and self-pay.  [facility] [place] [address] [place] Phone: [phone]   [place], [place], [place] [phone], Ext. [phone] Mondays & Thursdays: 7-9 AM.  First 15  patients are seen on a first come, first serve basis.    Justice Providers:  Organization         Address  Phone   Notes  [facility] [address], [place] [phone] Also accepts self-pay patients.  [facility] [address], [place]  [phone]   [place], [address], [place] [phone]   [facility] Family Medicine [address], [place] [phone]   Lucianne Lei [address], [place]   [phone] Only accepts [place] Access [place] patients after they have their name applied to their card.   Self-Pay (no insurance) in [facility]:  Organization         Address  Phone   Notes  Sickle Cell Patients, [facility] [place] [phone]   [facility] [place] [phone]   [facility] [place]  [place], [place], [place] [phone]     [facility]/Dr. Osei-Bonsu  [address], [place] or [address], [place] [phone] Phone number for both [place] and [place] locations is the same.  Urgent Medical and [facility] [address], [place] [phone]   [facility] [address], [place] or [address] [place] [address] [phone]   [facility] [address] [place] [phone], phone; [phone], fax Sees patients 1st and 3rd Saturday of every month.  Must not qualify for public or private insurance (i.e. Medicaid, Medicare, [place] Health Choice, Veterans' Benefits)  Household income should be no more than 200% of the poverty level The clinic cannot treat you if you are pregnant or think you are pregnant  Sexually transmitted diseases are not treated at the clinic.    Dental Care: Organization         Address  Phone  Notes  [facility] Department of [facility] [place] [phone] Accepts children up to age 52 who are enrolled in [place] or [place]; pregnant women with a Medicaid card; and children who have applied for Medicaid or [place] Health Choice, but were declined, whose parents can pay a reduced fee at time of service.  [facility] Department of [facility]  [address], [place] [phone] Accepts children up to age 73 who are enrolled in [place] or [place]; pregnant women with a Medicaid card; and children who have applied for Medicaid or [place] Health Choice, but were declined, whose parents can pay a reduced fee at time of service.  [facility] Adult [facility] Access PROGRAM  [place] [phone] Patients are seen by appointment only. Walk-ins are not accepted. [place] will see patients 57 years of age and older. Monday - Tuesday (8am-5pm) Most Wednesdays (8:30-5pm) $30 per visit,  cash only  [facility] Adult Dental Access PROGRAM  [address], [facility] [phone] Patients are seen by appointment only. Walk-ins are not accepted. [place] will see patients 18 years of age and older. One Wednesday Evening (Monthly: Volunteer Based).  $30 per visit, cash only  [place]  [phone] for adults; Children under age 85, call Graduate Pediatric  Dentistry at [phone]. Children aged 6-14, please call [phone] to request a pediatric application.  Dental services are provided in all areas of dental care including fillings, crowns and bridges, complete and partial dentures, implants, gum treatment, root canals, and extractions. Preventive care is also provided. Treatment is provided to both adults and children. Patients are selected via a lottery and there is often a waiting list.   [facility] [address], [place]  [phone] [email or web address]   [facility] [address] [place], [place] [phone], Ext. [phone] Second and Fourth Thursday of each month, opens at 6:30 AM; Clinic ends at 9 AM.  Patients are seen on a first-come first-served basis, and a limited number are seen during each clinic.   [facility]  [address] Hillard Danker [place], [place] [phone]   Eligibility Requirements You must have lived in [place], [place], or [place] counties for at least the last three months.   You cannot be eligible for state or federal sponsored [employer], including [employer], [place], or [employer].   You generally cannot be eligible for healthcare insurance through your employer.    How to apply: Eligibility screenings are held every Tuesday and Wednesday afternoon from 1:00 pm until 4:00 pm. You do not need an appointment for the interview!  [facility] [address], [place], [place]   [place]   [place] Department  [place]  [phone]    Behavioral Health Resources in the Community: Intensive Outpatient Programs Organization         Address  Phone  Notes  [place]. [address], [place], [place] [phone]   [facility] Outpatient [address] [place], [place]   ADS: Alcohol & Drug Svcs [address], [place], [place]   [place] [address]  [place], [place] or [phone]   Substance Abuse Resources Organization         Address  Phone  Notes  Alcohol and Drug Services  [phone]   [place]  [phone]   [facility] [place]   Chinita Pester  [phone]   Residential & Outpatient Substance Abuse Program  [phone]   Psychological Services Organization         Address  Phone  Notes  [facility] [place]  [place]  [phone]   [place] [address] [place] or [phone]    Mobile Crisis Teams Organization         Address  Phone  Notes  [facility], Mobile Crisis Care Unit  [phone]   [facility]ive Psychotherapeutic Services  [address] [place], [place]   [place] [address] [place] [phone]    Self-Help/Support Groups Organization         Address  Phone             Notes  [place]. of [place] - variety of support groups  [place] Call for more information  Narcotics Anonymous (NA), Caring Services [address], [employer] [place]  2 meetings at this location   Brewing technologist  Notes  ASAP Residential Treatment [place]  [place]  [phone]   [facility]  [address] [place] [address], [place], [place]    [place], [place] [phone] Admissions: 8am-3pm M-F  Incentives Substance [place] [address]    [place], [place] [phone]   [facility] [address] [place], [place], [place]   [facility] [address],  [place], [place]   [facility]ight Programs - Intensive Outpatient [address] Kristeen [address], [place], [place]   [facility] [place].,  [place], [place] [phone] or [phone]   [facility] Services (RTS) [address], [place], [place] Accepts Medicaid  [facility] [place] [address],  [place] [phone] Substance Abuse/Addiction Treatment   [facility] Organization         Address  Phone  Notes  [facility]  [phone]   Domenic Schwab, PhD [address] Arlis Porta [place], [place]   [phone] or [phone]   [place], [place] [phone]   [facility] [address] [place], [place] [phone] Insurance/Medicaid/sponsorship through [facility] and Families [address], [address] [place]                                    [place], [place] [phone] [place] [address], [place] [phone]    Dr. Adele Schilder  [phone]   [facility] Dept. 1) [address] [place] 2) [place] 3)  [place] [address], [place] [phone]  [phone]   [place] [phone] or [phone] (After Hours)

## 2014-10-12 NOTE — ED Notes (Signed)
Waiting for discharge

## 2014-10-13 LAB — GC/CHLAMYDIA PROBE AMP
CT Probe RNA: NEGATIVE
GC Probe RNA: NEGATIVE

## 2014-10-13 NOTE — ED Provider Notes (Signed)
Medical screening examination/treatment/procedure(s) were conducted as a shared visit with non-physician practitioner(s) or resident  and myself.  I personally evaluated the patient during the encounter and agree with the findings.   I have personally reviewed any xrays and/ or EKG's with the provider and I agree with interpretation.   Patient presents with mild lower mild pelvic discomfort for 3-4 days and mild vaginal discharge. Exam mild suprapubic tenderness no peritonitis, vaginal exam per physician assistant note. Patient well-appearing in ER and discussed outpatient followup with OB/GYN.  Results and differential diagnosis were discussed with the patient/parent/guardian. Close follow up outpatient was discussed, comfortable with the plan.   Medications - No data to display  Filed Vitals:   10/12/14 1156 10/12/14 1443 10/12/14 1756 10/12/14 2126  BP: 119/64 111/65 98/65 113/71  Pulse: 72 64 52 67  Temp: 98.5 F (36.9 C) 97.9 F (36.6 C) 98.2 F (36.8 C) 98.4 F (36.9 C)  TempSrc: Oral Oral  Oral  Resp: 16 20 16 16   SpO2: 98% 100% 99% 99%    Final diagnoses:  Pelvic pain in female  BV (bacterial vaginosis)  Uterine leiomyoma, unspecified location      Mariea Clonts, MD 10/13/14 0006

## 2014-10-23 ENCOUNTER — Encounter (HOSPITAL_COMMUNITY): Payer: Self-pay | Admitting: Emergency Medicine

## 2014-11-17 ENCOUNTER — Encounter (HOSPITAL_COMMUNITY): Payer: Self-pay

## 2014-11-17 ENCOUNTER — Emergency Department (HOSPITAL_COMMUNITY)
Admission: EM | Admit: 2014-11-17 | Discharge: 2014-11-17 | Disposition: A | Discharge status: Home / Self Care | Payer: Self-pay | Attending: Emergency Medicine | Admitting: Emergency Medicine

## 2014-11-17 DIAGNOSIS — H9313 Tinnitus, bilateral: Secondary | ICD-10-CM | POA: Insufficient documentation

## 2014-11-17 DIAGNOSIS — R51 Headache: Secondary | ICD-10-CM | POA: Insufficient documentation

## 2014-11-17 DIAGNOSIS — H6993 Unspecified Eustachian tube disorder, bilateral: Secondary | ICD-10-CM | POA: Insufficient documentation

## 2014-11-17 DIAGNOSIS — H6983 Other specified disorders of Eustachian tube, bilateral: Secondary | ICD-10-CM

## 2014-11-17 DIAGNOSIS — Z792 Long term (current) use of antibiotics: Secondary | ICD-10-CM | POA: Insufficient documentation

## 2014-11-17 DIAGNOSIS — Z79899 Other long term (current) drug therapy: Secondary | ICD-10-CM | POA: Insufficient documentation

## 2014-11-17 MED ORDER — ACETAMINOPHEN 500 MG PO TABS
1000.0000 mg | ORAL_TABLET | Freq: Once | ORAL | Status: AC
  Administered 2014-11-17: 1000 mg via ORAL
  Filled 2014-11-17: qty 2

## 2014-11-17 MED ORDER — FLUTICASONE PROPIONATE 50 MCG/ACT NA SUSP
2.0000 | Freq: Every day | NASAL | Status: DC
  Administered 2014-11-17: 2 via NASAL
  Filled 2014-11-17: qty 16

## 2014-11-17 MED ORDER — BUTALBITAL-APAP-CAFFEINE 50-325-40 MG PO TABS: 1.0000 | ORAL_TABLET | Freq: Four times a day (QID) | ORAL | Status: DC | PRN

## 2014-11-17 NOTE — ED Notes (Signed)
MD at bedside. 

## 2014-11-17 NOTE — ED Notes (Signed)
Pt started having pain in right ear.  Voices dull.  Decreased balance

## 2014-11-17 NOTE — Discharge Instructions (Signed)
Use nasal saline (you can try Arm and Hammer Simply Saline) at least 4 times a day, use saline 5-10 minutes before using the fluticasone (flonase)  Do not use Afrin (Oxymetazoline)  Rest, wash hands frequently  and drink plenty of water.  Do not hesitate to return to the emergency room for any new, worsening or concerning symptoms.  Please obtain primary care using resource guide below. But the minute you were seen in the emergency room and that they will need to obtain records for further outpatient management.     Emergency Department Resource Guide 1) Find a Doctor and Pay Out of Pocket Although you won't have to find out who is covered by your insurance plan, it is a good idea to ask around and get recommendations. You will then need to call the office and see if the doctor you have chosen will accept you as a new patient and what types of options they offer for patients who are self-pay. Some doctors offer discounts or will set up payment plans for their patients who do not have insurance, but you will need to ask so you aren't surprised when you get to your appointment.  2) Contact Your Local Health Department Not all health departments have doctors that can see patients for sick visits, but many do, so it is worth a call to see if yours does. If you don't know where your local health department is, you can check in your phone book. The CDC also has a tool to help you locate your state's health department, and many state websites also have listings of all of their local health departments.  3) Find a [facility] If your illness is not likely to be very severe or complicated, you may want to try a walk in clinic. These are popping up all over the country in pharmacies, drugstores, and shopping centers. They're usually staffed by nurse practitioners or physician assistants that have been trained to treat common illnesses and complaints. They're usually fairly quick and inexpensive.  However, if you have serious medical issues or chronic medical problems, these are probably not your best option.  No Primary Care Doctor: - Call Health Connect at  [phone] - they can help you locate a primary care doctor that  accepts your insurance, provides certain services, etc. - Physician Referral Service- [phone]  Chronic Pain Problems: Organization         Address  Phone   Notes  [facility]  [phone] Patients need to be referred by their primary care doctor.   Medication Assistance: Organization         Address  Phone   Notes  [facility] Medication [facility] [address] [place], [place] [address] [phone] --Must be a resident of [facility] -- Must have NO insurance coverage whatsoever (no Medicaid/ Medicare, etc.) -- The pt. MUST have a primary care doctor that directs their care regularly and follows them in the community   MedAssist  [phone]   [employer]  [phone]    Agencies that provide inexpensive medical care: Organization         Address  Phone   Notes  [place]  [phone]   [facility]    [phone]   [facility] [place], [place] [address] [phone]   [place] [address] [place] [phone]  Planned Parenthood    [phone]   [facility]    [phone]   Community Health and [place] [address], [place] Phone:  [phone], Fax:  [phone] Hours of Operation:  9 am - 6 pm, M-F.  Also accepts Medicaid/Medicare and self-pay.  [facility] for [place], [address], [place] Phone: [phone], Fax: [phone]. Hours of Operation:  8:30 am - 5:30 pm, M-F.  Also accepts Medicaid and self-pay.  [facility] [place] [address], [place] Phone: [phone]   [place], [place], [place] [phone], Ext. [phone] Mondays & Thursdays: 7-9 AM.  First 15 patients are seen on a first come, first serve basis.    [place] Providers:  Organization         Address  Phone   Notes  [facility] [address], [place] [phone] Also accepts self-pay patients.  [facility] [address], [place]  [phone]   [place], [address], [place] [phone]   [facility] [address], [place] [phone]   Lucianne Lei [address], [place]   [phone] Only accepts [place] Access [place] patients after they have their name applied to their card.   Self-Pay (no insurance) in [facility]:  Organization         Address  Phone   Notes  Sickle Cell Patients, [facility] [place] [phone]   [facility] Care [place] [phone]   [facility] [place]  [place], [place], [place] [phone]   [facility]/Dr. Osei-Bonsu  [address], [place] or [address], [place] [phone] Phone number for both [place] and [place] locations is the same.  [facility] [address], [place] [phone]   [facility] [address] [place] or [address] [phone]   [facility] [address] [place] [phone], phone; [phone], fax Sees patients 1st and 3rd Saturday of every month.  Must not qualify for public or private insurance (i.e. Medicaid, Medicare, [place] Health Choice, Veterans' Benefits)  Household income should be no more than 200% of the poverty level The clinic cannot treat you if you are pregnant or think  you are pregnant  Sexually transmitted diseases are not treated at the clinic.    Dental Care: Organization         Address  Phone  Notes  [facility] Department of [facility] [place] [phone] Accepts children up to age 21 who are enrolled in [place] or [place]; pregnant women with a Medicaid card; and children who have applied for Medicaid or [place] Health Choice, but were declined, whose parents can pay a reduced fee at time of service.  [facility] of [facility]  [address], [place] [phone] Accepts children up to age 54 who are enrolled in [place] or [place]; pregnant women with a Medicaid card; and children who have applied for Medicaid or [place], but were declined,  whose parents can pay a reduced fee at time of service.  [facility] PRO[facility]  [place] [phone] Patients are seen by appointment only. Walk-ins are not accepted. [place] will see patients 81 years of age and older. Monday - Tuesday (8am-5pm) Most Wednesdays (8:30-5pm) $30 per visit, cash only  [facility]  [address], [facility] [phone] Patients are seen by appointment only. Walk-ins are not accepted. [place] will see patients 73 years of age and older. One Wednesday Evening (Monthly: Volunteer Based).  $30 per visit, cash only  [place]  [phone] for adults; Children under age 38, call Graduate Pediatric Dentistry at [phone]. Children aged 33-14, please call [phone] to request a pediatric application.  Dental services are provided in all areas of dental care including fillings, crowns and bridges, complete and partial dentures, implants, gum treatment, root canals, and extractions. Preventive care is also provided. Treatment is provided to both adults and  children. Patients are selected via a lottery and there is often a waiting list.   [facility] [address] [place]  [phone] [email or web address]   [facility] [address] [place], [place] [phone], Ext. [phone] Second and Fourth Thursday of each month, opens at 6:30 AM; Clinic ends at 9 AM.  Patients are seen on a first-come first-served basis, and a limited number are seen during each clinic.   [facility]  [address] [place]ard Danker [place], [place] [phone]   Eligibility Requirements You must have lived in [place], [place], or [place] counties for at least the last three months.   You cannot be eligible for state or federal sponsored [employer], including [employer], [place], or [employer].   You generally cannot be eligible for healthcare insurance through your employer.    How to apply: Eligibility screenings are held every Tuesday and Wednesday afternoon from 1:00 pm until 4:00 pm. You do not need an appointment for the interview!  [facility] [address] [place], [place]   [place]  [place] Department  [place]  [phone]    Behavioral Health Resources in the Community: Intensive Outpatient Programs Organization         Address  Phone  Notes  [place]. [address] [place], [place] [phone]   [facility] Outpatient [address], [place], [place]   ADS: [facility] [address], [place], [place]   [place] [address]  [place], [place] or [phone]   Substance Abuse Resources Organization         Address  Phone  Notes  Alcohol and Drug Services  [phone]   [place]  [phone]   The [place]     Chinita Pester  [phone]   Residential & Outpatient Substance Abuse Program  [phone]   Psychological Services Organization         Address  Phone  Notes  [facility] [place]  [place]  [phone]   [place] [address], [place] or [phone]    Mobile Crisis Teams Organization         Address  Phone  Notes  [facility],  Mobile Crisis Care Unit  [phone]   [facility]ive Psychotherapeutic Services  [address] [place], [place]   [facility] [address] [place] [phone]    Self-Help/Support Groups Organization         Address  Phone             Notes  [place]. of [place] - variety of support groups  [place] Call for more information  Narcotics Anonymous (NA), [facility]aring [facility] [address], [employer] [place]  2 meetings at this location   Special educational needs teacher         Address  Phone  Notes  ASAP Residential Treatment [place],    [place]  [phone]   [facility]  [address] [place] [phone], [place], [place]   [place], [place] [phone] Admissions: 8am-3pm M-F  Incentives Substance [place] [address]    [place], [place] [phone]   [facility] [address] [place], [place], [place]   [facility] [address],  [place], [place]   [facility]ight Programs - Intensive Outpatient [address] Kristeen [address], [place], [place]   [facility] [address].,  [place], [place] [phone] or [phone]   [facility] (RTS) [address] [place], [place] Accepts Medicaid  [facility] [place] [address]  [place] [phone] Substance Abuse/Addiction Treatment   [facility] Organization         Address  Phone  Notes  [facility]  [phone]   Domenic Schwab, PhD [address] [place]lis [place], [place]   [phone] or [phone]   [place], [place] [phone]   [facility] [address] [place], [place] [phone] Insurance/Medicaid/sponsorship through [facility] and Families [address] [place]                                    [place], [place] [phone] [place] [address], [place] [phone]    Dr. Adele Schilder  [phone]   [facility] Dept. 1) [address], [place] 2) [place] 3)  [place] [address], [place] [phone]  [phone]   [place] [phone] or [phone] (After Hours)

## 2014-11-17 NOTE — ED Provider Notes (Signed)
CSN: [id number]     Arrival date & time 11/17/14  1118 History  This chart was scribed for Monico Blitz, PA-C, working with Charlesetta Shanks, MD found by Starleen Arms, ED Scribe. This patient was seen in room WTR7/WTR7 and the patient's care was started at 12:32 PM.   Chief Complaint  Patient presents with  . Otalgia   The history is provided by the patient. No language interpreter was used.   HPI Comments: Toni Chang is a 43 y.o. female who presents to the Emergency Department complaining of right ear pressure onset yesterday.  She denies alleviating factors but reports that the sound of physical voices causes pain in the right ear.  She denies pain otherwise.  She states that she intermittently has the sensation of sounds pulsating and oscillating in her ear.  She reports a current HA diffuse rated at 4/10.  Patient denies taking any mediation for pain.  Patient denies fever,rash, confusion, cervicalgia, LOC/syncope, change in vision, N/V, numbness, weakness, dysarthria, ataxia (Note this contradicts triage), thunderclap onset, exacerbation with exertion or valsalva, exacerbation in morning, CP, SOB, abdominal pain.  PCP: none Past Medical History  Diagnosis Date  . No pertinent past medical history    Past Surgical History  Procedure Laterality Date  . Shoulder surgery  06/2011    right rotator cuff  . Dilation and curettage of uterus  01/2011   Family History  Problem Relation Age of Onset  . Anesthesia problems Neg Hx   . Diabetes Mother   . Diabetes Father    History  Substance Use Topics  . Smoking status: Never Smoker   . Smokeless tobacco: Not on file  . Alcohol Use: No   OB History    Gravida Para Term Preterm AB TAB SAB Ectopic Multiple Living   4 4 4  0 0 0 0 0 0 4     Review of Systems A complete 10 system review of systems was obtained and all systems are negative except as noted in the HPI and PMH.   Allergies  Eggs or egg-derived products; Onion; and  Tomato  Home Medications   Prior to Admission medications   Medication Sig Start Date End Date Taking? Authorizing Provider  butalbital-acetaminophen-caffeine (FIORICET) 50-325-40 MG per tablet Take 1 tablet by mouth every 6 (six) hours as needed for headache. 11/17/14   Nazifa Trinka, PA-C  ibuprofen (ADVIL,MOTRIN) 800 MG tablet Take 1 tablet (800 mg total) by mouth every 8 (eight) hours as needed for mild pain or moderate pain. 05/15/14   Clayton Bibles, PA-C  metroNIDAZOLE (FLAGYL) 500 MG tablet Take 1 tablet (500 mg total) by mouth 2 (two) times daily. 10/12/14   Marissa Sciacca, PA-C  Multiple Vitamins-Iron (MULTIVITAMINS WITH IRON) TABS tablet Take 1 tablet by mouth daily.    Historical Provider, MD   BP 112/80 mmHg  Pulse 62  Temp(Src) 98.6 F (37 C) (Oral)  Resp 16  SpO2 100%  LMP 11/06/2014 Physical Exam  Constitutional: She is oriented to person, place, and time. She appears well-developed and well-nourished. No distress.  HENT:  Head: Normocephalic and atraumatic.  Mouth/Throat: Oropharynx is clear and moist.  No drooling or stridor. Posterior pharynx mildly erythematous no significant tonsillar hypertrophy. No exudate. Soft palate rises symmetrically. No TTP or induration under tongue.   No tenderness to palpation of frontal or bilateral maxillary sinuses.  No mucosal edema in the nares.  Bilateral tympanic membranes with normal architecture and good light reflex.    Eyes:  Conjunctivae and EOM are normal. Pupils are equal, round, and reactive to light.  Neck: Normal range of motion. Neck supple.  No midline C-spine  tenderness to palpation or step-offs appreciated. Patient has full range of motion without pain.   Cardiovascular: Normal rate, regular rhythm and intact distal pulses.   Pulmonary/Chest: Effort normal and breath sounds normal. No stridor. No respiratory distress. She has no wheezes. She has no rales. She exhibits no tenderness.  Abdominal: Soft. Bowel  sounds are normal. There is no tenderness.  Musculoskeletal: Normal range of motion.  Neurological: She is alert and oriented to person, place, and time.  II-Visual fields grossly intact. III/IV/VI-Extraocular movements intact.  Pupils reactive bilaterally. V/VII-Smile symmetric, equal eyebrow raise,  facial sensation intact VIII- Hearing grossly intact IX/X-Normal gag XI-bilateral shoulder shrug XII-midline tongue extension Motor: 5/5 bilaterally with normal tone and bulk Cerebellar: Normal finger-to-nose  and normal heel-to-shin test.   Romberg negative Ambulates with a coordinated gait   Psychiatric: She has a normal mood and affect.  Nursing note and vitals reviewed.   ED Course  Procedures (including critical care time)  DIAGNOSTIC STUDIES: Oxygen Saturation is 99% on RA, normal by my interpretation.    COORDINATION OF CARE:  12:39 PM Will prescribe nasal decongestant as well as medication for HA.  Patient advised to follow up with ENT if no resolution within a weak.    Labs Review Labs Reviewed - No data to display  Imaging Review No results found.   EKG Interpretation None      MDM   Final diagnoses:  Tinnitus, bilateral  Eustachian tube dysfunction, bilateral    Filed Vitals:   11/17/14 1127 11/17/14 1346  BP: 113/70 112/80  Pulse: 69 62  Temp: 97.8 F (36.6 C) 98.6 F (37 C)  TempSrc: Oral Oral  Resp: 18 16  SpO2: 99% 100%    Medications  fluticasone (FLONASE) 50 MCG/ACT nasal spray 2 spray (2 sprays Each Nare Given 11/17/14 1334)  acetaminophen (TYLENOL) tablet 1,000 mg (1,000 mg Oral Given 11/17/14 1247)    Toni Chang is a 43 y.o. female presenting with sound sensitivity, tinnitus, mild global headache. This is likely eustachian tube dysfunction. HA presentation is nonconcerning for [facility], ICH, Meningitis, or temporal arteritis. Pt is afebrile with no focal neuro deficits, nuchal rigidity, or change in vision. Pt is to follow up with PCP  to discuss prophylactic medication. Pt verbalizes understanding and is agreeable with plan to dc.  We have discussed return precautions. I've asked her to follow with ear nose and throat for further evaluation.  This is a shared visit with the attending physician who personally evaluated the patient and agrees with the care plan.    Evaluation does not show pathology that would require ongoing emergent intervention or inpatient treatment. Pt is hemodynamically stable and mentating appropriately. Discussed findings and plan with patient/guardian, who agrees with care plan. All questions answered. Return precautions discussed and outpatient follow up given.   Discharge Medication List as of 11/17/2014  2:03 PM    START taking these medications   Details  butalbital-acetaminophen-caffeine (FIORICET) 50-325-40 MG per tablet Take 1 tablet by mouth every 6 (six) hours as needed for headache., Starting 11/17/2014, Until Discontinued, Print        I personally performed the services described in this documentation, which was scribed in my presence. The recorded information has been reviewed and is accurate.     Monico Blitz, PA-C 11/17/14 1334  [place], PA-C 11/17/14  [place], MD 11/18/14 [phone]

## 2015-04-25 ENCOUNTER — Encounter (HOSPITAL_COMMUNITY): Payer: Self-pay

## 2015-04-25 ENCOUNTER — Emergency Department (HOSPITAL_COMMUNITY)
Admission: EM | Admit: 2015-04-25 | Discharge: 2015-04-25 | Disposition: A | Discharge status: Home / Self Care | Payer: Medicaid Other | Attending: Emergency Medicine | Admitting: Emergency Medicine

## 2015-04-25 ENCOUNTER — Emergency Department (HOSPITAL_COMMUNITY): Payer: Medicaid Other

## 2015-04-25 DIAGNOSIS — Z3202 Encounter for pregnancy test, result negative: Secondary | ICD-10-CM | POA: Diagnosis not present

## 2015-04-25 DIAGNOSIS — R51 Headache: Secondary | ICD-10-CM | POA: Diagnosis present

## 2015-04-25 DIAGNOSIS — Z792 Long term (current) use of antibiotics: Secondary | ICD-10-CM | POA: Diagnosis not present

## 2015-04-25 DIAGNOSIS — Z79899 Other long term (current) drug therapy: Secondary | ICD-10-CM | POA: Insufficient documentation

## 2015-04-25 DIAGNOSIS — R519 Headache, unspecified: Secondary | ICD-10-CM

## 2015-04-25 DIAGNOSIS — R110 Nausea: Secondary | ICD-10-CM | POA: Insufficient documentation

## 2015-04-25 DIAGNOSIS — H53149 Visual discomfort, unspecified: Secondary | ICD-10-CM | POA: Insufficient documentation

## 2015-04-25 LAB — CBC WITH DIFFERENTIAL/PLATELET
Basophils Absolute: 0 10*3/uL (ref 0.0–0.1)
Basophils Relative: 1 % (ref 0–1)
Eosinophils Absolute: 0 10*3/uL (ref 0.0–0.7)
Eosinophils Relative: 1 % (ref 0–5)
HCT: 35.1 % — ABNORMAL LOW (ref 36.0–46.0)
HEMOGLOBIN: 11.8 g/dL — AB (ref 12.0–15.0)
Lymphocytes Relative: 41 % (ref 12–46)
Lymphs Abs: 1.4 10*3/uL (ref 0.7–4.0)
MCH: 30.2 pg (ref 26.0–34.0)
MCHC: 33.6 g/dL (ref 30.0–36.0)
MCV: 89.8 fL (ref 78.0–100.0)
MONOS PCT: 5 % (ref 3–12)
Monocytes Absolute: 0.2 10*3/uL (ref 0.1–1.0)
NEUTROS PCT: 52 % (ref 43–77)
Neutro Abs: 1.8 10*3/uL (ref 1.7–7.7)
Platelets: 223 10*3/uL (ref 150–400)
RBC: 3.91 MIL/uL (ref 3.87–5.11)
RDW: 12.3 % (ref 11.5–15.5)
WBC: 3.5 10*3/uL — ABNORMAL LOW (ref 4.0–10.5)

## 2015-04-25 LAB — BASIC METABOLIC PANEL
ANION GAP: 3 — AB (ref 5–15)
BUN: 12 mg/dL (ref 6–20)
CHLORIDE: 107 mmol/L (ref 101–111)
CO2: 29 mmol/L (ref 22–32)
Calcium: 8.9 mg/dL (ref 8.9–10.3)
Creatinine, Ser: 1.03 mg/dL — ABNORMAL HIGH (ref 0.44–1.00)
Glucose, Bld: 88 mg/dL (ref 70–99)
Potassium: 4 mmol/L (ref 3.5–5.1)
SODIUM: 139 mmol/L (ref 135–145)

## 2015-04-25 LAB — POC URINE PREG, ED: Preg Test, Ur: NEGATIVE

## 2015-04-25 MED ORDER — ONDANSETRON HCL 4 MG/2ML IJ SOLN
4.0000 mg | Freq: Once | INTRAMUSCULAR | Status: AC
  Administered 2015-04-25: 4 mg via INTRAVENOUS
  Filled 2015-04-25: qty 2

## 2015-04-25 MED ORDER — DIPHENHYDRAMINE HCL 50 MG/ML IJ SOLN
25.0000 mg | Freq: Once | INTRAMUSCULAR | Status: AC
  Administered 2015-04-25: 25 mg via INTRAVENOUS
  Filled 2015-04-25: qty 1

## 2015-04-25 MED ORDER — SODIUM CHLORIDE 0.9 % IV BOLUS (SEPSIS)
1000.0000 mL | Freq: Once | INTRAVENOUS | Status: AC
  Administered 2015-04-25: 1000 mL via INTRAVENOUS

## 2015-04-25 MED ORDER — PROMETHAZINE HCL 25 MG/ML IJ SOLN
12.5000 mg | Freq: Once | INTRAMUSCULAR | Status: AC
  Administered 2015-04-25: 12.5 mg via INTRAVENOUS
  Filled 2015-04-25: qty 1

## 2015-04-25 MED ORDER — DEXAMETHASONE SODIUM PHOSPHATE 10 MG/ML IJ SOLN
10.0000 mg | Freq: Once | INTRAMUSCULAR | Status: AC
  Administered 2015-04-25: 10 mg via INTRAVENOUS
  Filled 2015-04-25: qty 1

## 2015-04-25 MED ORDER — SODIUM CHLORIDE 0.9 % IV SOLN
INTRAVENOUS | Status: DC
  Administered 2015-04-25: 100 mL/h via INTRAVENOUS

## 2015-04-25 NOTE — ED Notes (Signed)
Awake. Verbally responsive. A/O x4. Resp even and unlabored. No audible adventitious breath sounds noted. ABC's intact. IV infusing NS at 999ml/hr without difficulty. 

## 2015-04-25 NOTE — ED Notes (Signed)
Awake. Verbally responsive. A/O x4. Resp even and unlabored. No audible adventitious breath sounds noted. ABC's intact. Reported having headache 5/10. No n/v/d noted.

## 2015-04-25 NOTE — ED Notes (Signed)
Pt reported having intermittent headache since Sunday with nausea starting yesterday. Reported that Tylenol, Motrin, and Excedrin did not help. Pt denies visual disturbances and dizziness/lightheadedness. NIH negative.

## 2015-04-25 NOTE — Discharge Instructions (Signed)
Resource guide provided below to help you find a primary care doctor. Go home and rest. Headache should resolve over the next 24 hours. Work note provided for 2 days. If headache recurs then MRI of the brain will be required. Return for any new or worse symptoms.   Emergency Department Resource Guide 1) Find a Doctor and Pay Out of Pocket Although you won't have to find out who is covered by your insurance plan, it is a good idea to ask around and get recommendations. You will then need to call the office and see if the doctor you have chosen will accept you as a new patient and what types of options they offer for patients who are self-pay. Some doctors offer discounts or will set up payment plans for their patients who do not have insurance, but you will need to ask so you aren't surprised when you get to your appointment.  2) Contact Your Local Health Department Not all health departments have doctors that can see patients for sick visits, but many do, so it is worth a call to see if yours does. If you don't know where your local health department is, you can check in your phone book. The CDC also has a tool to help you locate your state's health department, and many state websites also have listings of all of their local health departments.  3) Find a [facility] If your illness is not likely to be very severe or complicated, you may want to try a walk in clinic. These are popping up all over the country in pharmacies, drugstores, and shopping centers. They're usually staffed by nurse practitioners or physician assistants that have been trained to treat common illnesses and complaints. They're usually fairly quick and inexpensive. However, if you have serious medical issues or chronic medical problems, these are probably not your best option.  No Primary Care Doctor: - Call Health Connect at  [phone] - they can help you locate a primary care doctor that  accepts your insurance, provides certain  services, etc. - Physician Referral Service- [phone]  Chronic Pain Problems: Organization         Address  Phone   Notes  [facility]  [phone] Patients need to be referred by their primary care doctor.   Medication Assistance: Organization         Address  Phone   Notes  [facility] Medication [facility] [address] [place], [place] [address] [phone] --Must be a resident of [facility] -- Must have NO insurance coverage whatsoever (no Medicaid/ Medicare, etc.) -- The pt. MUST have a primary care doctor that directs their care regularly and follows them in the community   Med[facility]ist  [phone]   [employer]  [phone]    Agencies that provide inexpensive medical care: Organization         Address  Phone   Notes  [place]  [phone]   [facility]    [phone]   [facility] [place], [place] [address] [phone]   [place] [address], [place] [phone]   Planned Parenthood    [phone]   [facility]    [phone]   [place] and [place] [address], [place] Phone:  [phone], Fax:  [phone] Hours of Operation:  9 am -  6 pm, M-F.  Also accepts Medicaid/Medicare and self-pay.  [facility] [place] [place] [address], [address], [place] Phone: [phone], Fax: [phone]. Hours of Operation:  8:30 am - 5:30 pm, M-F.  Also accepts Medicaid and self-pay.  [facility] [place] [address] [place] Phone: [phone]   [place], [place], [place] [phone], Ext. [phone] Mondays & Thursdays: 7-9 AM.  First 15 patients are seen on a first come, first serve basis.    [place] Providers:  Organization         Address  Phone   Notes  [facility] [address], [place] [phone] Also accepts self-pay patients.  [facility] [address], [place]  [phone]   [place], [address], [place] [phone]   [facility] [address] [place] [phone]   Lucianne Lei [address], [place]   [phone] Only accepts [place] Access [place] patients after they have their name applied to their card.   Self-Pay (no insurance) in [facility]:  Organization         Address  Phone   Notes  Sickle Cell Patients, [facility] [place] [phone]   [facility] [place] [phone]   [facility] [place]  [place], [place],  [phone]   [facility]/Dr. Osei-Bonsu  [address] [place] or [address], [place] [phone] Phone number for both [place] and [place] locations is the same.  [facility] [address] [place] [phone]   [facility] [address], [place] or [address] [phone]   [facility] [address], [place] [phone], phone; [phone], fax Sees patients 1st and 3rd Saturday of every month.  Must not qualify for public or private insurance (i.e. Medicaid, Medicare, [place] Health Choice, Veterans' Benefits)  Household income should be no more than 200% of the poverty level The clinic cannot treat you if you are pregnant or think you are pregnant  Sexually transmitted diseases are not treated at the clinic.    Dental Care: Organization         Address  Phone  Notes  [facility] Department of [facility] [place] [phone] Accepts  children up to age 4 who are enrolled in [place] or [place]; pregnant women with a Medicaid card; and children who have applied for Medicaid or [place] Health Choice, but were declined, whose parents can pay a reduced fee at time of service.  [facility]  [address], [place] [phone] Accepts children up to age 26 who are enrolled in [place] or [place]; pregnant women with a Medicaid card; and children who have applied for Medicaid or  Health Choice, but were declined, whose parents can pay a reduced fee at time of service.  [place] Adult Dental Access PROGRAM  [place] [phone] Patients are seen by appointment only. Walk-ins are not accepted. [place] will see patients 72 years of age and  older. Monday - Tuesday (8am-5pm) Most Wednesdays (8:30-5pm) $30 per visit, cash only  [place] Adult [employer] PROGRAM  [address], [facility] [phone] Patients are seen by appointment only. Walk-ins are not accepted. [place] will see patients 78 years of age and older. One Wednesday Evening (Monthly: Volunteer Based).  $30 per visit, cash only  [place]  [phone] for adults; Children under age 67, call Graduate Pediatric Dentistry at [phone]. Children aged 22-14, please call [phone] to request a pediatric application.  Dental services are provided in all areas of dental care including fillings, crowns and bridges, complete and partial dentures, implants, gum treatment, root canals, and extractions. Preventive care is also provided. Treatment is provided to both adults and children. Patients are selected via a lottery and there is often a waiting list.   [facility] [place] [address], [place]  [phone] [email or web address]   [facility] [address] [place], [place] [phone], Ext. [phone] Second and  Fourth Thursday of each month, opens at 6:30 AM; Clinic ends at 9 AM.  Patients are seen on a first-come first-served basis, and a limited number are seen during each clinic.   [facility]  [address] Hillard Danker [place], [place] [phone]   Eligibility Requirements You must have lived in [place], [place], or [place] counties for at least the last three months.   You cannot be eligible for state or federal sponsored [employer], including [employer], [place], or [employer].   You generally cannot be eligible for healthcare insurance through your employer.    How to apply: Eligibility screenings are held every Tuesday and Wednesday afternoon from 1:00 pm until 4:00 pm. You do not need an appointment for the interview!  [facility] [address], [place], [place]   [place]  [place] Department  [place]  [phone]    Behavioral Health Resources in the Community: Intensive Outpatient Programs Organization         Address  Phone  Notes  [place]. [address], [place], [place] [phone]   [facility] [address] [place], [place]   ADS: Alcohol & Drug Svcs [address] [place], [place]   [place] [address],  [place], [place] or [phone]   Substance Abuse Resources Organization         Address  Phone  Notes  Alcohol and Drug Services  [phone]   [place]  [phone]   [facility] [place]   Chinita Pester  [phone]   Residential & Outpatient Substance Abuse Program  [phone]   Psychological Services Organization         Address  Phone  Notes  [facility] [place]  [place]  [phone]   [place]  [address] [place] [phone] or [phone]    Mobile Crisis Teams Organization         Address  Phone  Notes  [facility], Mobile Crisis Care Unit  [phone]   [facility] Psychotherapeutic Services  [address] [place], [place]   [facility] [address], [place] [phone]    Self-Help/Support Groups Organization         Address  Phone             Notes  [facility] [place] - variety of support groups  [place] Call for more information  Narcotics Anonymous (NA), [facility] [address], [employer]   2 meetings at this location   Special educational needs teacher         Address  Phone  Notes  [facility] Residential Treatment [place],    [place]  [phone]   [facility]  [address], [place] [address], [place], [place]   [place], [place] [phone] Admissions: 8am-3pm M-F  Incentives Substance [place] [address],    [place], [place] [phone]   [facility] [address] [place], [place], [place]   [facility] [address],  [place], [place]   [facility] Programs - Intensive Outpatient [address], [place], [place]   [facility] [place] ([place].) [address]  [place], [place] [phone] or [phone]   [facility] Services (RTS) [address] [place], [place] Accepts Medicaid  [facility] [place] [address],  [place] [phone] Substance Abuse/Addiction Treatment   [facility] Organization         Address  Phone  Notes  [facility]  [phone]   Domenic Schwab, PhD [address] Arlis Porta [place], [place]   [phone] or [phone]   [place], [place] [phone]   [facility] [address] [place], [place] [phone] Insurance/Medicaid/sponsorship through [facility] and Families [address], [address]                                    [place], [place] [phone] [place] [address], [place] [phone]    Dr. Adele Schilder  [phone]   [facility] Dept. 1) [address] [place] 2) [place] 3)  [place] [address], [place] [phone]  [phone]   [place] [phone] or [phone] (After Hours)

## 2015-04-25 NOTE — ED Notes (Signed)
Resting quietly with eye closed. Easily arousable. Verbally responsive. Resp even and unlabored. ABC's intact. IV infusing NS at 130ml/hr without difficulty. NAD noted.

## 2015-04-25 NOTE — ED Provider Notes (Signed)
CSN: [id number]     Arrival date & time 04/25/15  0906 History   First MD Initiated Contact with Patient 04/25/15 [phone]     Chief Complaint  Patient presents with  . Headache  . Nausea     (Consider location/radiation/quality/duration/timing/severity/associated sxs/prior Treatment) Patient is a 44 y.o. female presenting with headaches. The history is provided by the patient.  Headache Associated symptoms: nausea and photophobia   Associated symptoms: no abdominal pain, no back pain, no congestion, no diarrhea, no fever, no neck pain, no neck stiffness, no numbness, no vomiting and no weakness    onset of frontal headache on and off starting on Sunday became constant yesterday at 4 in the afternoon. Associated with nausea no vomiting slight photophobia no fever no chills no neck pain no back pain. Patient does not have a history of migraines. The headache is not throbbing it's an ache in nature. Patient has not had a similar type headache. The headache was not sudden onset it was on and off intermittent for the first 2 days and then became constant yesterday afternoon. But not significantly more severe. Patient states that they headache pain is 5 out of 10 currently at its worse it was an 8 out of 10.  Past Medical History  Diagnosis Date  . No pertinent past medical history    Past Surgical History  Procedure Laterality Date  . Shoulder surgery  06/2011    right rotator cuff  . Dilation and curettage of uterus  01/2011   Family History  Problem Relation Age of Onset  . Anesthesia problems Neg Hx   . Diabetes Mother   . Diabetes Father    History  Substance Use Topics  . Smoking status: Never Smoker   . Smokeless tobacco: Not on file  . Alcohol Use: No   OB History    Gravida Para Term Preterm AB TAB SAB Ectopic Multiple Living   4 4 4  0 0 0 0 0 0 4     Review of Systems  Constitutional: Negative for fever.  HENT: Negative for congestion.   Eyes: Positive for photophobia.   Respiratory: Negative for shortness of breath.   Cardiovascular: Negative for chest pain.  Gastrointestinal: Positive for nausea. Negative for vomiting, abdominal pain and diarrhea.  Genitourinary: Negative for dysuria.  Musculoskeletal: Negative for back pain, neck pain and neck stiffness.  Skin: Negative for rash.  Neurological: Positive for headaches. Negative for syncope, speech difficulty, weakness and numbness.  Hematological: Does not bruise/bleed easily.  Psychiatric/Behavioral: Negative for confusion.      Allergies  Eggs or egg-derived products; Onion; and Tomato  Home Medications   Prior to Admission medications   Medication Sig Start Date End Date Taking? Authorizing Provider  Multiple Vitamins-Iron (MULTIVITAMINS WITH IRON) TABS tablet Take 1 tablet by mouth daily.   Yes Historical Provider, MD  butalbital-acetaminophen-caffeine (FIORICET) 50-325-40 MG per tablet Take 1 tablet by mouth every 6 (six) hours as needed for headache. Patient not taking: Reported on 04/25/2015 11/17/14   Elmyra Ricks Pisciotta, PA-C  ibuprofen (ADVIL,MOTRIN) 800 MG tablet Take 1 tablet (800 mg total) by mouth every 8 (eight) hours as needed for mild pain or moderate pain. Patient not taking: Reported on 04/25/2015 05/15/14   Clayton Bibles, PA-C  metroNIDAZOLE (FLAGYL) 500 MG tablet Take 1 tablet (500 mg total) by mouth 2 (two) times daily. Patient not taking: Reported on 04/25/2015 10/12/14   Marissa Sciacca, PA-C   BP 112/75 mmHg  Pulse 50  Temp(Src)  97.9 F (36.6 C) (Oral)  Resp 18  SpO2 100%  LMP 03/23/2015 (Exact Date) Physical Exam  Constitutional: She is oriented to person, place, and time. She appears well-developed and well-nourished. No distress.  HENT:  Head: Normocephalic and atraumatic.  Mouth/Throat: Oropharynx is clear and moist.  Eyes: Conjunctivae and EOM are normal. Pupils are equal, round, and reactive to light.  Neck: Normal range of motion. Neck supple.  Cardiovascular: Normal  rate, regular rhythm and normal heart sounds.   No murmur heard. Pulmonary/Chest: Effort normal and breath sounds normal.  Abdominal: Soft. Bowel sounds are normal.  Musculoskeletal: Normal range of motion. She exhibits no edema.  Neurological: She is alert and oriented to person, place, and time. No cranial nerve deficit. She exhibits normal muscle tone. Coordination normal.  Skin: Skin is warm. No rash noted.  Nursing note and vitals reviewed.   ED Course  Procedures (including critical care time) Labs Review Labs Reviewed  CBC WITH DIFFERENTIAL/PLATELET - Abnormal; Notable for the following:    WBC 3.5 (*)    Hemoglobin 11.8 (*)    HCT 35.1 (*)    All other components within normal limits  BASIC METABOLIC PANEL - Abnormal; Notable for the following:    Creatinine, Ser 1.03 (*)    Anion gap 3 (*)    All other components within normal limits  POC URINE PREG, ED    Imaging Review Ct Head Wo Contrast  04/25/2015   CLINICAL DATA:  Headache  EXAM: CT HEAD WITHOUT CONTRAST  TECHNIQUE: Contiguous axial images were obtained from the base of the skull through the vertex without intravenous contrast.  COMPARISON:  CT 08/04/2007  FINDINGS: Ventricle size is normal. Negative for acute or chronic infarction. Negative for hemorrhage or fluid collection. Negative for mass or edema. No shift of the midline structures.  Calvarium is intact.  IMPRESSION: Negative   Electronically Signed   By: Franchot Gallo M.D.   On: 04/25/2015 12:01     EKG Interpretation None      MDM   Final diagnoses:  Headache    Workup for the headache without any significant findings. Head CT negative. Patient with a complaint of a headache on and off since Sunday then became constant at 4:00 yesterday afternoon. Was not of sudden onset. Associated with some nausea but no vomiting some slight photophobia. No history of migraines headache is frontal in nature it's an ache it is not throbbing. No concerns for  subarachnoid hemorrhage based on the history. Patient treated with migraine cocktail and significant improvement in the headache. Down to 2 out of 10 it was a 10 out of 10. Patient understands that if the headache recurs or becomes a recurrent or persistent problem we'll need MRI. Labs without any significant abnormalities.  Fredia Sorrow, MD 04/25/15 [phone]

## 2015-04-25 NOTE — ED Notes (Signed)
Resting quietly with eye closed. Easily arousable. Verbally responsive. Room darkened. Resp even and unlabored. ABC's intact. IV infusing NS at 128ml/hr without difficulty. NAD noted.

## 2015-04-25 NOTE — ED Notes (Signed)
Pt c/o intermittent headache and nausea x 3 days.  Pain score 5/10.  Denies light sensitivity and blurred vision.  Pt reports taking OTC pain medication w/o relief.  Sts "brief nosebleed yesterday."

## 2015-04-25 NOTE — ED Notes (Signed)
Resting quietly with eye closed. Easily arousable. Verbally responsive. Resp even and unlabored. ABC's intact. Pt reported headache better of 2/10. IV infusing NS at 151ml/hr without difficulty.

## 2015-05-14 ENCOUNTER — Ambulatory Visit: Payer: Self-pay | Admitting: Family Medicine

## 2015-05-24 ENCOUNTER — Emergency Department (HOSPITAL_COMMUNITY)
Admission: EM | Admit: 2015-05-24 | Discharge: 2015-05-24 | Disposition: A | Discharge status: Home / Self Care | Payer: Medicaid Other | Attending: Emergency Medicine | Admitting: Emergency Medicine

## 2015-05-24 ENCOUNTER — Encounter (HOSPITAL_COMMUNITY): Payer: Self-pay

## 2015-05-24 DIAGNOSIS — M545 Low back pain, unspecified: Secondary | ICD-10-CM

## 2015-05-24 DIAGNOSIS — Z79899 Other long term (current) drug therapy: Secondary | ICD-10-CM | POA: Insufficient documentation

## 2015-05-24 DIAGNOSIS — M549 Dorsalgia, unspecified: Secondary | ICD-10-CM | POA: Diagnosis present

## 2015-05-24 MED ORDER — HYDROCODONE-ACETAMINOPHEN 5-325 MG PO TABS
1.0000 | ORAL_TABLET | Freq: Once | ORAL | Status: AC
  Administered 2015-05-24: 1 via ORAL
  Filled 2015-05-24: qty 1

## 2015-05-24 MED ORDER — NAPROXEN 500 MG PO TABS: 500.0000 mg | ORAL_TABLET | Freq: Two times a day (BID) | ORAL | Status: DC

## 2015-05-24 MED ORDER — DIAZEPAM 2 MG PO TABS: 2.0000 mg | ORAL_TABLET | Freq: Four times a day (QID) | ORAL | Status: DC | PRN

## 2015-05-24 MED ORDER — TRAMADOL HCL 50 MG PO TABS: 50.0000 mg | ORAL_TABLET | Freq: Four times a day (QID) | ORAL | Status: DC | PRN

## 2015-05-24 NOTE — Discharge Instructions (Signed)
SEEK IMMEDIATE MEDICAL ATTENTION IF: New numbness, tingling, weakness, or problem with the use of your arms or legs.  Severe back pain not relieved with medications.  Change in bowel or bladder control.  Increasing pain in any areas of the body (such as chest or abdominal pain).  Shortness of breath, dizziness or fainting.  Nausea (feeling sick to your stomach), vomiting, fever, or sweats.  Back Pain, Adult Low back pain is very common. About 1 in 5 people have back pain.The cause of low back pain is rarely dangerous. The pain often gets better over time.About half of people with a sudden onset of back pain feel better in just 2 weeks. About 8 in 10 people feel better by 6 weeks.  CAUSES Some common causes of back pain include:  Strain of the muscles or ligaments supporting the spine.  Wear and tear (degeneration) of the spinal discs.  Arthritis.  Direct injury to the back. DIAGNOSIS Most of the time, the direct cause of low back pain is not known.However, back pain can be treated effectively even when the exact cause of the pain is unknown.Answering your caregiver's questions about your overall health and symptoms is one of the most accurate ways to make sure the cause of your pain is not dangerous. If your caregiver needs more information, he or she may order lab work or imaging tests (X-rays or MRIs).However, even if imaging tests show changes in your back, this usually does not require surgery. HOME CARE INSTRUCTIONS For many people, back pain returns.Since low back pain is rarely dangerous, it is often a condition that people can learn to [facility] their own.   Remain active. It is stressful on the back to sit or stand in one place. Do not sit, drive, or stand in one place for more than 30 minutes at a time. Take short walks on level surfaces as soon as pain allows.Try to increase the length of time you walk each day.  Do not stay in bed.Resting more than 1 or 2 days can delay  your recovery.  Do not avoid exercise or work.Your body is made to move.It is not dangerous to be active, even though your back may hurt.Your back will likely heal faster if you return to being active before your pain is gone.  Pay attention to your body when you bend and lift. Many people have less discomfortwhen lifting if they bend their knees, keep the load close to their bodies,and avoid twisting. Often, the most comfortable positions are those that put less stress on your recovering back.  Find a comfortable position to sleep. Use a firm mattress and lie on your side with your knees slightly bent. If you lie on your back, put a pillow under your knees.  Only take over-the-counter or prescription medicines as directed by your caregiver. Over-the-counter medicines to reduce pain and inflammation are often the most helpful.Your caregiver may prescribe muscle relaxant drugs.These medicines help dull your pain so you can more quickly return to your normal activities and healthy exercise.  Put ice on the injured area.  Put ice in a plastic bag.  Place a towel between your skin and the bag.  Leave the ice on for 15-20 minutes, 03-04 times a day for the first 2 to 3 days. After that, ice and heat may be alternated to reduce pain and spasms.  Ask your caregiver about trying back exercises and gentle massage. This may be of some benefit.  Avoid feeling anxious or stressed.Stress  increases muscle tension and can worsen back pain.It is important to recognize when you are anxious or stressed and learn ways to manage it.Exercise is a great option. SEEK MEDICAL CARE IF:  You have pain that is not relieved with rest or medicine.  You have pain that does not improve in 1 week.  You have new symptoms.  You are generally not feeling well. SEEK IMMEDIATE MEDICAL CARE IF:   You have pain that radiates from your back into your legs.  You develop new bowel or bladder control  problems.  You have unusual weakness or numbness in your arms or legs.  You develop nausea or vomiting.  You develop abdominal pain.  You feel faint. Document Released: 12/08/2005 Document Revised: 06/08/2012 Document Reviewed: 04/11/2014 [facility] Patient Information 2015 [place], [place]. This information is not intended to replace advice given to you by your health care provider. Make sure you discuss any questions you have with your health care provider.

## 2015-05-24 NOTE — ED Provider Notes (Signed)
CSN: [id number]     Arrival date & time 05/24/15  1212 History  This chart was scribed for Margarita Mail, PA-C, working with Virgel Manifold, MD by Julien Nordmann, ED Scribe. This patient was seen in room WTR5/WTR5 and the patient's care was started at 12:29 PM.    Chief Complaint  Patient presents with  . Back Pain      The history is provided by the patient. No language interpreter was used.    HPI Comments: Toni Chang is a 44 y.o. female who presents to the Emergency Department complaining of a constant, gradual worsening left sided back pain onset 3 days ago. She reports the pain as "achy" without radiation.Pt notes the pain has started out as dull but gradually gotten worse. She notes that no matter what she does, the pain is still prevalent. Pt has tried heating pads, OTC pain medication, and massages to alleviate the pain with no relief. She denies numbness, tingling, weakness in leg, urinary symptoms, bowel/bladder incontinence.   Past Medical History  Diagnosis Date  . No pertinent past medical history    Past Surgical History  Procedure Laterality Date  . Shoulder surgery  06/2011    right rotator cuff  . Dilation and curettage of uterus  01/2011   Family History  Problem Relation Age of Onset  . Anesthesia problems Neg Hx   . Diabetes Mother   . Diabetes Father    History  Substance Use Topics  . Smoking status: Never Smoker   . Smokeless tobacco: Never Used  . Alcohol Use: No   OB History    Gravida Para Term Preterm AB TAB SAB Ectopic Multiple Living   4 4 4  0 0 0 0 0 0 4     Review of Systems  Genitourinary: Negative.   Musculoskeletal: Positive for back pain.  Neurological: Negative for weakness and numbness.      Allergies  Eggs or egg-derived products; Onion; and Tomato  Home Medications   Prior to Admission medications   Medication Sig Start Date End Date Taking? Authorizing Provider  butalbital-acetaminophen-caffeine (FIORICET) 50-325-40  MG per tablet Take 1 tablet by mouth every 6 (six) hours as needed for headache. Patient not taking: Reported on 04/25/2015 11/17/14   Elmyra Ricks Pisciotta, PA-C  ibuprofen (ADVIL,MOTRIN) 800 MG tablet Take 1 tablet (800 mg total) by mouth every 8 (eight) hours as needed for mild pain or moderate pain. Patient not taking: Reported on 04/25/2015 05/15/14   Clayton Bibles, PA-C  metroNIDAZOLE (FLAGYL) 500 MG tablet Take 1 tablet (500 mg total) by mouth 2 (two) times daily. Patient not taking: Reported on 04/25/2015 10/12/14   Marissa Sciacca, PA-C  Multiple Vitamins-Iron (MULTIVITAMINS WITH IRON) TABS tablet Take 1 tablet by mouth daily.    Historical Provider, MD   Triage vitals: BP 109/76 mmHg  Pulse 71  Temp(Src) 98.3 F (36.8 C) (Oral)  Resp 16  Ht 5\' 3"  (1.6 m)  Wt 185 lb (83.915 kg)  BMI 32.78 kg/m2  SpO2 99%  LMP 04/26/2015 Physical Exam  Constitutional: She is oriented to person, place, and time. She appears well-developed and well-nourished. No distress.  HENT:  Head: Normocephalic.  Eyes: Conjunctivae are normal. Pupils are equal, round, and reactive to light. No scleral icterus.  Neck: Normal range of motion. Neck supple. No thyromegaly present.  Cardiovascular: Normal rate and regular rhythm.   Pulmonary/Chest: Effort normal and breath sounds normal. No respiratory distress. She has no wheezes. She has no rales.  Abdominal: Soft. Bowel sounds are normal.  Musculoskeletal:  Negative straight leg raise on the left. ROM limited due to pain Acute spastic tissue in lower para spine lumbar region  Neurological: She is alert and oriented to person, place, and time. She has normal reflexes.  Skin: Skin is warm and dry.  Psychiatric: She has a normal mood and affect. Her behavior is normal.  Nursing note and vitals reviewed.   ED Course  Procedures  DIAGNOSTIC STUDIES: Oxygen Saturation is 99% on RA, normal by my interpretation.  COORDINATION OF CARE:  12:35 PM Discussed treatment plan  which includes anti-inflammatories with pt at bedside and pt agreed to plan.   Labs Review Labs Reviewed - No data to display  Imaging Review No results found.   EKG Interpretation None      MDM   Final diagnoses:  Left-sided low back pain without sciatica    .Patient with back pain.  No neurological deficits and normal neuro exam.  Patient can walk but states is painful.  No loss of bowel or bladder control.  No concern for cauda equina.  No fever, night sweats, weight loss, h/o cancer, IVDU.  RICE protocol and pain medicine indicated and discussed with patient.   I personally performed the services described in this documentation, which was scribed in my presence. The recorded information has been reviewed and is accurate.      Margarita Mail, PA-C 05/24/15 1252  Virgel Manifold, MD 05/24/15 [phone]

## 2015-05-24 NOTE — ED Notes (Signed)
Patient c/o left lower back pain that began last night. Patient also reports pain in theleft leg when she ambulates. Patient denies any weakness of extremities, numbness, or tingling.

## 2015-08-08 ENCOUNTER — Ambulatory Visit (INDEPENDENT_AMBULATORY_CARE_PROVIDER_SITE_OTHER): Payer: Medicaid Other | Admitting: Obstetrics & Gynecology

## 2015-08-08 ENCOUNTER — Other Ambulatory Visit (HOSPITAL_COMMUNITY)
Admission: RE | Admit: 2015-08-08 | Discharge: 2015-08-08 | Disposition: A | Discharge status: Home / Self Care | Payer: Medicaid Other | Source: Ambulatory Visit | Attending: Obstetrics & Gynecology | Admitting: Obstetrics & Gynecology

## 2015-08-08 ENCOUNTER — Encounter: Payer: Self-pay | Admitting: Obstetrics & Gynecology

## 2015-08-08 VITALS — BP 105/61 | HR 63 | Temp 98.5°F | Ht 62.0 in | Wt 189.6 lb

## 2015-08-08 DIAGNOSIS — Z0001 Encounter for general adult medical examination with abnormal findings: Secondary | ICD-10-CM

## 2015-08-08 DIAGNOSIS — R102 Pelvic and perineal pain: Secondary | ICD-10-CM | POA: Diagnosis not present

## 2015-08-08 DIAGNOSIS — Z01419 Encounter for gynecological examination (general) (routine) without abnormal findings: Secondary | ICD-10-CM

## 2015-08-08 DIAGNOSIS — N941 Dyspareunia: Secondary | ICD-10-CM | POA: Diagnosis not present

## 2015-08-08 DIAGNOSIS — D259 Leiomyoma of uterus, unspecified: Secondary | ICD-10-CM

## 2015-08-08 DIAGNOSIS — N760 Acute vaginitis: Secondary | ICD-10-CM

## 2015-08-08 DIAGNOSIS — N946 Dysmenorrhea, unspecified: Secondary | ICD-10-CM

## 2015-08-08 DIAGNOSIS — N898 Other specified noninflammatory disorders of vagina: Secondary | ICD-10-CM

## 2015-08-08 DIAGNOSIS — B9689 Other specified bacterial agents as the cause of diseases classified elsewhere: Secondary | ICD-10-CM

## 2015-08-08 DIAGNOSIS — Z1151 Encounter for screening for human papillomavirus (HPV): Secondary | ICD-10-CM | POA: Diagnosis present

## 2015-08-08 DIAGNOSIS — IMO0002 Reserved for concepts with insufficient information to code with codable children: Secondary | ICD-10-CM

## 2015-08-08 MED ORDER — NAPROXEN 500 MG PO TABS: 500.0000 mg | ORAL_TABLET | Freq: Two times a day (BID) | ORAL | Status: DC

## 2015-08-08 NOTE — Progress Notes (Signed)
GYNECOLOGY CLINIC ANNUAL PREVENTATIVE CARE ENCOUNTER NOTE  Subjective:   Toni Chang is a 44 y.o. [phone] female here for a routine annual gynecologic exam.  Current complaints: pelvic pain: dyspareunia/dysmenorrhea that she attributes to her fibroid.  Ultrasound in 09/2014 showed a 2.8 cm fibroid.  Reported being diagnosed with endometriosis in 2012 by Dr. Kennon Rounds after hysteroscopy.  Wants surgery to remove to fibroid, does not want to lose her fertility.    Denies other gynecologic concerns.    Gynecologic History Patient's last menstrual period was 07/11/2015. Contraception: none Last Pap: 2013. Results were: normal Last mammogram: 2013. Results were: normal  Obstetric History OB History  Gravida Para Term Preterm AB SAB TAB Ectopic Multiple Living  4 4 4  0 0 0 0 0 0 4    # Outcome Date GA Lbr Len/2nd Weight Sex Delivery Anes PTL Lv  4 Term           3 Term           2 Term           1 Term               Past Medical History  Diagnosis Date  . No pertinent past medical history     Past Surgical History  Procedure Laterality Date  . Shoulder surgery  06/2011    right rotator cuff  . Dilation and curettage of uterus  01/2011    Current Outpatient Prescriptions on File Prior to Visit  Medication Sig Dispense Refill  . Multiple Vitamins-Iron (MULTIVITAMINS WITH IRON) TABS tablet Take 1 tablet by mouth daily.     No current facility-administered medications on file prior to visit.    Allergies  Allergen Reactions  . Eggs Or Egg-Derived Products Hives  . Onion Hives  . Tomato Hives    Social History   Social History  . Marital Status: Married    Spouse Name: N/A  . Number of Children: N/A  . Years of Education: N/A   Occupational History  . Not on file.   Social History Main Topics  . Smoking status: Never Smoker   . Smokeless tobacco: Never Used  . Alcohol Use: No  . Drug Use: No  . Sexual Activity: Yes    Birth Control/ Protection: None    Other Topics Concern  . Not on file   Social History Narrative    Family History  Problem Relation Age of Onset  . Anesthesia problems Neg Hx   . Diabetes Mother   . Diabetes Father     The following portions of the patient's history were reviewed and updated as appropriate: allergies, current medications, past family history, past medical history, past social history, past surgical history and problem list.  Review of Systems Pertinent items are noted in HPI.   Objective:  BP 105/61 mmHg  Pulse 63  Temp(Src) 98.5 F (36.9 C) (Oral)  Ht 5\' 2"  (1.575 m)  Wt 189 lb 9.6 oz (86.002 kg)  BMI 34.67 kg/m2  LMP 07/11/2015 CONSTITUTIONAL: Well-developed, well-nourished female in no acute distress.  HENT:  Normocephalic, atraumatic, External right and left ear normal. Oropharynx is clear and moist EYES: Conjunctivae and EOM are normal. Pupils are equal, round, and reactive to light. No scleral icterus.  NECK: Normal range of motion, supple, no masses.  Normal thyroid.  SKIN: Skin is warm and dry. No rash noted. Not diaphoretic. No erythema. No pallor. [place]: Alert and oriented to person, place, and  time. Normal reflexes, muscle tone coordination. No cranial nerve deficit noted. PSYCHIATRIC: Normal mood and affect. Normal behavior. Normal judgment and thought content. CARDIOVASCULAR: Normal heart rate noted, regular rhythm RESPIRATORY: Clear to auscultation bilaterally. Effort and breath sounds normal, no problems with respiration noted. BREASTS: Symmetric in size. No masses, skin changes, nipple drainage, or lymphadenopathy. ABDOMEN: Soft, normal bowel sounds, no distention noted.  Diffuse lower abdominal tenderness, no rebound or guarding.  PELVIC: Normal appearing external genitalia; normal appearing vaginal mucosa and cervix.  Thin, white foul-smelling discharge noted, wet prep obtained.  Pap smear obtained.  Enlarged 14 week uterine size which was tender to palpation, no other  palpable masses, no uterine or adnexal tenderness. MUSCULOSKELETAL: Normal range of motion. No tenderness.  No cyanosis, clubbing, or edema.  2+ distal pulses.  Imaging 10/12/2014  TRANSABDOMINAL AND TRANSVAGINAL ULTRASOUND OF PELVIS CLINICAL DATA: Pelvic pain and vaginal odor. COMPARISON: Pelvis CT dated 03/23/2014 and pelvic ultrasound dated 02/21/2014. FINDINGS: Uterus Measurements: 10.5 x 6.0 x 5.1 cm. 2.8 x 2.5 x 2.0 cm oval myometrial mass in the fundus anteriorly on the right. This previously measured 2.2 x 1.9 x 1.7 cm. No submucosal component seen. Endometrium Thickness: 5.6 mm. No focal abnormality visualized. Right ovary Measurements: 3.6 x 2.8 x 1.7 cm. 1.6 cm follicle. Normal appearing internal blood flow with color Doppler. Left ovary Measurements: 4.3 x 3.8 x 2.1 cm. 2.6 m follicle. Normal appearing internal blood flow with color Doppler. Other findings No free fluid. IMPRESSION: Interval mild increase in size of the previously demonstrated fundal uterine fibroid. This currently measures 2.8 cm in maximum diameter. Otherwise, normal examination.  Assessment:  Annual gynecologic examination with pap smear Pelvic pain, fibroid   Plan:  Will follow up results of pap smear and manage accordingly. Will also follow wet prep and manage accordingly. Mammogram scheduled For her pelvic pain, discussed various etiologies for this and emphasized that endometriosis cannot be diagnosed on hysteroscopy.  Pain could be due to her fibroid, will get repeat ultrasound and compare it to the one done in 09/2014.   Routine preventative health maintenance measures emphasized. Please refer to After Visit Summary for other counseling recommendations.    Verita Schneiders, MD, [facility] Attending Obstetrician & Gynecologist, [place] for [facility]

## 2015-08-08 NOTE — Patient Instructions (Signed)

## 2015-08-09 ENCOUNTER — Telehealth: Payer: Self-pay | Admitting: General Practice

## 2015-08-09 LAB — WET PREP, GENITAL
Trich, Wet Prep: NONE SEEN
YEAST WET PREP: NONE SEEN

## 2015-08-09 MED ORDER — METRONIDAZOLE 500 MG PO TABS: 500.0000 mg | ORAL_TABLET | Freq: Two times a day (BID) | ORAL | Status: DC

## 2015-08-09 NOTE — Telephone Encounter (Signed)
Called patient regarding BV results and med sent to pharmacy. No answer- left message stating we are trying to reach you with some non urgent results, please call us back at the clinics

## 2015-08-09 NOTE — Addendum Note (Signed)
Addended by: Verita Schneiders A on: 08/09/2015 08:17 AM   Modules accepted: Orders

## 2015-08-10 ENCOUNTER — Ambulatory Visit (HOSPITAL_COMMUNITY): Payer: Medicaid Other

## 2015-08-10 LAB — CYTOLOGY - PAP

## 2015-08-10 NOTE — Telephone Encounter (Signed)
Received another message on nurse line left 08/10/15 at 0905.  Patient states she is returning our call.  Called patient and explained she has bacterial vaginosis.  Explained we have phoned in a prescription to her [facility] on [address].  Told patient to pick up medication and take as directed until it is finished.  Patient states understanding.

## 2015-08-10 NOTE — Telephone Encounter (Signed)
Received message on nurse line left today at [facility].  Called patient.  No answer.  Left message that we are trying to reach you.  Explained we have called in a medication for you, please go to your pharmacy to pick it up.  Explained our office closes at 12 pm today, asked patient to call so we could explain if she gets our message before 12 pm.

## 2015-08-16 ENCOUNTER — Other Ambulatory Visit: Payer: Self-pay | Admitting: General Practice

## 2015-08-16 ENCOUNTER — Ambulatory Visit (HOSPITAL_COMMUNITY)
Admission: RE | Admit: 2015-08-16 | Discharge: 2015-08-16 | Disposition: A | Discharge status: Home / Self Care | Payer: Medicaid Other | Source: Ambulatory Visit | Attending: Obstetrics & Gynecology | Admitting: Obstetrics & Gynecology

## 2015-08-16 DIAGNOSIS — Z01419 Encounter for gynecological examination (general) (routine) without abnormal findings: Secondary | ICD-10-CM

## 2015-08-16 DIAGNOSIS — N946 Dysmenorrhea, unspecified: Secondary | ICD-10-CM

## 2015-08-16 DIAGNOSIS — Z1231 Encounter for screening mammogram for malignant neoplasm of breast: Secondary | ICD-10-CM

## 2015-08-16 DIAGNOSIS — N8320 Unspecified ovarian cysts: Secondary | ICD-10-CM | POA: Diagnosis not present

## 2015-08-16 DIAGNOSIS — N852 Hypertrophy of uterus: Secondary | ICD-10-CM | POA: Insufficient documentation

## 2015-08-16 DIAGNOSIS — D259 Leiomyoma of uterus, unspecified: Secondary | ICD-10-CM | POA: Diagnosis not present

## 2015-08-16 DIAGNOSIS — IMO0002 Reserved for concepts with insufficient information to code with codable children: Secondary | ICD-10-CM

## 2015-08-22 ENCOUNTER — Telehealth: Payer: Self-pay | Admitting: *Deleted

## 2015-08-22 NOTE — Telephone Encounter (Signed)
Per Dr. Harolyn Rutherford, US shows single 2.8cm fibroid for which surgery is not indicated. There is also a small simple cyst on Lt ovary and surgery is not indicated as this is physiologic. Normal ultrasound otherwise. Pt was called and informed of results. Pt was advised that no further treatment is indicated. She can take ibuprofen (Advil) or Aleve for any abdominal cramping or pain that she may have but that nothing stronger should be needed. She voiced understanding.

## 2015-11-13 ENCOUNTER — Emergency Department (HOSPITAL_COMMUNITY): Payer: Medicaid Other

## 2015-11-13 ENCOUNTER — Emergency Department (HOSPITAL_COMMUNITY)
Admission: EM | Admit: 2015-11-13 | Discharge: 2015-11-13 | Disposition: A | Discharge status: Home / Self Care | Payer: Medicaid Other | Attending: Emergency Medicine | Admitting: Emergency Medicine

## 2015-11-13 ENCOUNTER — Encounter (HOSPITAL_COMMUNITY): Payer: Self-pay | Admitting: Emergency Medicine

## 2015-11-13 DIAGNOSIS — M25551 Pain in right hip: Secondary | ICD-10-CM | POA: Insufficient documentation

## 2015-11-13 DIAGNOSIS — M25512 Pain in left shoulder: Secondary | ICD-10-CM | POA: Insufficient documentation

## 2015-11-13 DIAGNOSIS — Z3202 Encounter for pregnancy test, result negative: Secondary | ICD-10-CM | POA: Diagnosis not present

## 2015-11-13 DIAGNOSIS — Z79899 Other long term (current) drug therapy: Secondary | ICD-10-CM | POA: Insufficient documentation

## 2015-11-13 DIAGNOSIS — Z792 Long term (current) use of antibiotics: Secondary | ICD-10-CM | POA: Diagnosis not present

## 2015-11-13 DIAGNOSIS — M542 Cervicalgia: Secondary | ICD-10-CM

## 2015-11-13 LAB — POC URINE PREG, ED: Preg Test, Ur: NEGATIVE

## 2015-11-13 MED ORDER — IBUPROFEN 800 MG PO TABS
800.0000 mg | ORAL_TABLET | Freq: Once | ORAL | Status: AC
  Administered 2015-11-13: 800 mg via ORAL
  Filled 2015-11-13: qty 1

## 2015-11-13 MED ORDER — IBUPROFEN 800 MG PO TABS: 800.0000 mg | ORAL_TABLET | Freq: Three times a day (TID) | ORAL | Status: DC

## 2015-11-13 NOTE — ED Notes (Signed)
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 C/o 4 day hx of intermittent r/hip pain, denies trauma

## 2015-11-13 NOTE — ED Notes (Signed)
Per pt, states right neck and hip pain for a couple of days-no injury

## 2015-11-13 NOTE — ED Provider Notes (Signed)
CSN: [id number]     Arrival date & time 11/13/15  1132 History  By signing my name below, I, Eustaquio Maize, attest that this documentation has been prepared under the direction and in the presence of Gloriann Loan, PA-C. Electronically Signed: Eustaquio Maize, ED Scribe. 11/13/2015. 12:33 PM.  Chief Complaint  Patient presents with  . right neck and hip pain    The history is provided by the patient. No language interpreter was used.     HPI Comments: Toni Chang is a 44 y.o. female who presents to the Emergency Department complaining of sudden onset, constant, sharp, left shoulder pain x 1 day. 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 The pain is exacerbated with movement and intermittently radiates to the left side of her neck. Pt also complains of unrelated, gradual onset, intermittent, right hip pain for the past 2-3 days, constant for the past day. There was no injury that caused the hip pain. When pt stands up she gets a stabbing sensation to her hip. It is exacerbated with prolonged standing or sitting. Pt has taken Naproxen for the shoulder pain and hip pain without relief. Denies numbness weakness or tingling to extremities, dizziness, lightheadedness, fever, or any other associated symptoms.   Past Medical History  Diagnosis Date  . No pertinent past medical history    Past Surgical History  Procedure Laterality Date  . Shoulder surgery  06/2011    right rotator cuff  . Dilation and curettage of uterus  01/2011   Family History  Problem Relation Age of Onset  . Anesthesia problems Neg Hx   . Diabetes Mother   . Diabetes Father    Social History  Substance Use Topics  . Smoking status: Never Smoker   . Smokeless tobacco: Never Used  . Alcohol Use: No   OB History    Gravida Para Term Preterm AB TAB SAB Ectopic Multiple Living   4 4 4  0 0 0 0 0 0 4     Review of Systems  A complete 10 system review of  systems was obtained and all systems are negative except as noted in the HPI and PMH.   Allergies  Eggs or egg-derived products; Onion; and Tomato  Home Medications   Prior to Admission medications   Medication Sig Start Date End Date Taking? Authorizing Provider  ibuprofen (ADVIL,MOTRIN) 800 MG tablet Take 1 tablet (800 mg total) by mouth 3 (three) times daily. 11/13/15   Gloriann Loan, PA-C  metroNIDAZOLE (FLAGYL) 500 MG tablet Take 1 tablet (500 mg total) by mouth 2 (two) times daily. 08/09/15   Osborne Oman, MD  Multiple Vitamins-Iron (MULTIVITAMINS WITH IRON) TABS tablet Take 1 tablet by mouth daily.    Historical Provider, MD  naproxen (NAPROSYN) 500 MG tablet Take 1 tablet (500 mg total) by mouth 2 (two) times daily with a meal. As needed for pain 08/08/15   Osborne Oman, MD   Triage Vitals: BP 109/70 mmHg  Pulse 59  Temp(Src) 98.1 F (36.7 C) (Oral)  Resp 16  SpO2 100%  LMP 10/30/2015   Physical Exam  Constitutional: She is oriented to person, place, and time. She appears well-developed and well-nourished.  HENT:  Head: Normocephalic and atraumatic.  Mouth/Throat: Oropharynx is clear and moist.  Eyes: Conjunctivae are normal.  Neck: Normal range of motion. Neck supple. No rigidity.    Cardiovascular: Normal rate, regular rhythm and normal  heart sounds.   No murmur heard. Pulses:      Radial pulses are 2+ on the right side, and 2+ on the left side.       Posterior tibial pulses are 2+ on the right side, and 2+ on the left side.  Pulmonary/Chest: Effort normal and breath sounds normal. No accessory muscle usage or stridor. No respiratory distress. She has no wheezes. She has no rhonchi. She has no rales.  Abdominal: Soft. Bowel sounds are normal. She exhibits no distension. There is no tenderness.  Musculoskeletal:       Right shoulder: Normal.       Left shoulder: She exhibits decreased range of motion (secondary to pain), pain and spasm. She exhibits no tenderness,  no bony tenderness, no swelling, no effusion, no crepitus, no deformity, no laceration, normal pulse and normal strength.       Right hip: She exhibits tenderness and bony tenderness. She exhibits normal range of motion, normal strength, no swelling, no crepitus, no deformity and no laceration.       Left hip: Normal.       Legs: Lymphadenopathy:    She has no cervical adenopathy.  Neurological: She is alert and oriented to person, place, and time.  Speech clear without dysarthria.  Strength and sensation intact bilaterally in upper and lower extremities.  Gait normal without ataxia.   Skin: Skin is warm and dry.  Psychiatric: She has a normal mood and affect. Her behavior is normal.    ED Course  Procedures (including critical care time)  DIAGNOSTIC STUDIES: Oxygen Saturation is 100% on RA, normal by my interpretation.    COORDINATION OF CARE: 12:31 PM-Discussed treatment plan which includes Rx muscle relaxant with pt at bedside and pt agreed to plan.   Labs Review Labs Reviewed  POC URINE PREG, ED    Imaging Review Dg Shoulder Left  11/13/2015  CLINICAL DATA:  Left shoulder pain for 1 day. EXAM: LEFT SHOULDER - 2+ VIEW COMPARISON:  None. FINDINGS: There is no evidence of fracture or dislocation. There is no evidence of arthropathy or other focal bone abnormality. Soft tissues are unremarkable. IMPRESSION: Negative. Electronically Signed   By: Kerby Moors M.D.   On: 11/13/2015 13:47   Dg Hip Unilat With Pelvis 2-3 Views Right  11/13/2015  CLINICAL DATA:  Acute right hip pain for 2-3 days. EXAM: DG HIP (WITH OR WITHOUT PELVIS) 2-3V RIGHT COMPARISON:  03/23/2014 FINDINGS: Normal right hip alignment without subluxation, dislocation, acute osseous finding or fracture. Sclerotic bone islands present in the right ischial tuberosity and the left superior acetabular margin. Normal SI joints. Left hip appears unremarkable. Sclerosis of the symphysis pubis present bilaterally compatible  with osteitis pubis, mild-to-moderate. Normal bowel gas pattern. IMPRESSION: No acute finding of the right hip by plain radiography. 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 Electronically Signed   By: Jerilynn Mages.  Shick M.D.   On: 11/13/2015 13:46     EKG Interpretation None      MDM   Final diagnoses:  Neck pain on left side  Right hip pain   Patient presents with right hip pain and left shoulder/neck pain.  VSS, NAD.  On exam, NVI.  Decreased right shoulder ROM secondary to pain.  Left hip TTP.  No swelling or erythema.  Patient ambulatory without difficulty.  Plain films of left hip sho mild-moderate midline osteitis pubis.  Plain films of left shoulder negative.  Will give ibuprofen for pain.    Evaluation does not show pathology  requring ongoing emergent intervention or admission. Pt is hemodynamically stable and mentating appropriately. Discussed findings/results and plan with patient/guardian, who agrees with plan. All questions answered. Return precautions discussed and outpatient follow up given.   I personally performed the services described in this documentation, which was scribed in my presence. The recorded information has been reviewed and is accurate.      Gloriann Loan, PA-C 11/13/15 [place], MD 11/13/15 [phone]

## 2015-11-13 NOTE — Discharge Instructions (Signed)
Cervical Strain and Sprain With Rehab  Cervical strain and sprain are injuries that commonly occur with "whiplash" injuries. Whiplash occurs when the neck is forcefully whipped backward or forward, such as during a motor vehicle accident or during contact sports. The muscles, ligaments, tendons, discs, and nerves of the neck are susceptible to injury when this occurs.  RISK FACTORS  Risk of having a whiplash injury increases if:  · Osteoarthritis of the spine.  · Situations that make head or neck accidents or trauma more likely.  · High-risk sports (football, rugby, wrestling, hockey, auto racing, gymnastics, diving, contact karate, or boxing).  · Poor strength and flexibility of the neck.  · Previous neck injury.  · Poor tackling technique.  · Improperly fitted or padded equipment.  SYMPTOMS   · Pain or stiffness in the front or back of neck or both.  · Symptoms may present immediately or up to 24 hours after injury.  · Dizziness, headache, nausea, and vomiting.  · Muscle spasm with soreness and stiffness in the neck.  · Tenderness and swelling at the injury site.  PREVENTION  · Learn and use proper technique (avoid tackling with the head, spearing, and head-butting; use proper falling techniques to avoid landing on the head).  · Warm up and stretch properly before activity.  · Maintain physical fitness:    Strength, flexibility, and endurance.    Cardiovascular fitness.  · Wear properly fitted and padded protective equipment, such as padded soft collars, for participation in contact sports.  PROGNOSIS   Recovery from cervical strain and sprain injuries is dependent on the extent of the injury. These injuries are usually curable in 1 week to 3 months with appropriate treatment.   RELATED COMPLICATIONS   · Temporary numbness and weakness may occur if the nerve roots are damaged, and this may persist until the nerve has completely healed.  · Chronic pain due to frequent recurrence of symptoms.  · Prolonged healing,  especially if activity is resumed too soon (before complete recovery).  TREATMENT   Treatment initially involves the use of ice and medication to help reduce pain and inflammation. It is also important to perform strengthening and stretching exercises and modify activities that worsen symptoms so the injury does not get worse. These exercises may be performed at home or with a therapist. For patients who experience severe symptoms, a soft, padded collar may be recommended to be worn around the neck.   Improving your posture may help reduce symptoms. Posture improvement includes pulling your chin and abdomen in while sitting or standing. If you are sitting, sit in a firm chair with your buttocks against the back of the chair. While sleeping, try replacing your pillow with a small towel rolled to 2 inches in diameter, or use a cervical pillow or soft cervical collar. Poor sleeping positions delay healing.   For patients with nerve root damage, which causes numbness or weakness, the use of a cervical traction apparatus may be recommended. Surgery is rarely necessary for these injuries. However, cervical strain and sprains that are present at birth (congenital) may require surgery.  MEDICATION   · If pain medication is necessary, nonsteroidal anti-inflammatory medications, such as aspirin and ibuprofen, or other minor pain relievers, such as acetaminophen, are often recommended.  · Do not take pain medication for 7 days before surgery.  · Prescription pain relievers may be given if deemed necessary by your caregiver. Use only as directed and only as much as you need.    HEAT AND COLD:   · Cold treatment (icing) relieves pain and reduces inflammation. Cold treatment should be applied for 10 to 15 minutes every 2 to 3 hours for inflammation and pain and immediately after any activity that aggravates your symptoms. Use ice packs or an ice massage.  · Heat treatment may be used prior to performing the stretching and  strengthening activities prescribed by your caregiver, physical therapist, or athletic trainer. Use a heat pack or a warm soak.  SEEK MEDICAL CARE IF:   · Symptoms get worse or do not improve in 2 weeks despite treatment.  · New, unexplained symptoms develop (drugs used in treatment may produce side effects).  EXERCISES  RANGE OF MOTION (ROM) AND STRETCHING EXERCISES - Cervical Strain and Sprain  These exercises may help you when beginning to rehabilitate your injury. In order to successfully resolve your symptoms, you must improve your posture. These exercises are designed to help reduce the forward-head and rounded-shoulder posture which contributes to this condition. Your symptoms may resolve with or without further involvement from your physician, physical therapist or athletic trainer. While completing these exercises, remember:   · Restoring tissue flexibility helps normal motion to return to the joints. This allows healthier, less painful movement and activity.  · An effective stretch should be held for at least 20 seconds, although you may need to begin with shorter hold times for comfort.  · A stretch should never be painful. You should only feel a gentle lengthening or release in the stretched tissue.  STRETCH- Axial Extensors  · Lie on your back on the floor. You may bend your knees for comfort. Place a rolled-up hand towel or dish towel, about 2 inches in diameter, under the part of your head that makes contact with the floor.  · Gently tuck your chin, as if trying to make a "double chin," until you feel a gentle stretch at the base of your head.  · Hold __________ seconds.  Repeat __________ times. Complete this exercise __________ times per day.   STRETCH - Axial Extension   · Stand or sit on a firm surface. Assume a good posture: chest up, shoulders drawn back, abdominal muscles slightly tense, knees unlocked (if standing) and feet hip width apart.  · Slowly retract your chin so your head slides back  and your chin slightly lowers. Continue to look straight ahead.  · You should feel a gentle stretch in the back of your head. Be certain not to feel an aggressive stretch since this can cause headaches later.  · Hold for __________ seconds.  Repeat __________ times. Complete this exercise __________ times per day.  STRETCH - Cervical Side Bend   · Stand or sit on a firm surface. Assume a good posture: chest up, shoulders drawn back, abdominal muscles slightly tense, knees unlocked (if standing) and feet hip width apart.  · Without letting your nose or shoulders move, slowly tip your right / left ear to your shoulder until your feel a gentle stretch in the muscles on the opposite side of your neck.  · Hold __________ seconds.  Repeat __________ times. Complete this exercise __________ times per day.  STRETCH - Cervical Rotators   · Stand or sit on a firm surface. Assume a good posture: chest up, shoulders drawn back, abdominal muscles slightly tense, knees unlocked (if standing) and feet hip width apart.  · Keeping your eyes level with the ground, slowly turn your head until you feel a gentle stretch along   the back and opposite side of your neck.  · Hold __________ seconds.  Repeat __________ times. Complete this exercise __________ times per day.  RANGE OF MOTION - Neck Circles   · Stand or sit on a firm surface. Assume a good posture: chest up, shoulders drawn back, abdominal muscles slightly tense, knees unlocked (if standing) and feet hip width apart.  · Gently roll your head down and around from the back of one shoulder to the back of the other. The motion should never be forced or painful.  · Repeat the motion 10-20 times, or until you feel the neck muscles relax and loosen.  Repeat __________ times. Complete the exercise __________ times per day.  STRENGTHENING EXERCISES - Cervical Strain and Sprain  These exercises may help you when beginning to rehabilitate your injury. They may resolve your symptoms with or  without further involvement from your physician, physical therapist, or athletic trainer. While completing these exercises, remember:   · Muscles can gain both the endurance and the strength needed for everyday activities through controlled exercises.  · Complete these exercises as instructed by your physician, physical therapist, or athletic trainer. Progress the resistance and repetitions only as guided.  · You may experience muscle soreness or fatigue, but the pain or discomfort you are trying to eliminate should never worsen during these exercises. If this pain does worsen, stop and make certain you are following the directions exactly. If the pain is still present after adjustments, discontinue the exercise until you can discuss the trouble with your clinician.  STRENGTH - Cervical Flexors, Isometric  · Face a wall, standing about 6 inches away. Place a small pillow, a ball about 6-8 inches in diameter, or a folded towel between your forehead and the wall.  · Slightly tuck your chin and gently push your forehead into the soft object. Push only with mild to moderate intensity, building up tension gradually. Keep your jaw and forehead relaxed.  · Hold 10 to 20 seconds. Keep your breathing relaxed.  · Release the tension slowly. Relax your neck muscles completely before you start the next repetition.  Repeat __________ times. Complete this exercise __________ times per day.  STRENGTH- Cervical Lateral Flexors, Isometric   · Stand about 6 inches away from a wall. Place a small pillow, a ball about 6-8 inches in diameter, or a folded towel between the side of your head and the wall.  · Slightly tuck your chin and gently tilt your head into the soft object. Push only with mild to moderate intensity, building up tension gradually. Keep your jaw and forehead relaxed.  · Hold 10 to 20 seconds. Keep your breathing relaxed.  · Release the tension slowly. Relax your neck muscles completely before you start the next  repetition.  Repeat __________ times. Complete this exercise __________ times per day.  STRENGTH - Cervical Extensors, Isometric   · Stand about 6 inches away from a wall. Place a small pillow, a ball about 6-8 inches in diameter, or a folded towel between the back of your head and the wall.  · Slightly tuck your chin and gently tilt your head back into the soft object. Push only with mild to moderate intensity, building up tension gradually. Keep your jaw and forehead relaxed.  · Hold 10 to 20 seconds. Keep your breathing relaxed.  · Release the tension slowly. Relax your neck muscles completely before you start the next repetition.  Repeat __________ times. Complete this exercise __________ times per day.    All of your joints have less wear and tear when properly supported by a spine with good posture. This means you will experience a healthier, less painful body.  Correct posture must be practiced with all of your activities, especially prolonged sitting and standing. Correct posture is as important when doing repetitive low-stress activities (typing) as it is when doing a single heavy-load activity (lifting). PROLONGED STANDING WHILE SLIGHTLY LEANING FORWARD When completing a task that requires you to lean forward while standing in one  place for a long time, place either foot up on a stationary 2- to 4-inch high object to help maintain the best posture. When both feet are on the ground, the low back tends to lose its slight inward curve. If this curve flattens (or becomes too large), then the back and your other joints will experience too much stress, fatigue more quickly, and can cause pain.  RESTING POSITIONS Consider which positions are most painful for you when choosing a resting position. If you have pain with flexion-based activities (sitting, bending, stooping, squatting), choose a position that allows you to rest in a less flexed posture. You would want to avoid curling into a fetal position on your side. If your pain worsens with extension-based activities (prolonged standing, working overhead), avoid resting in an extended position such as sleeping on your stomach. Most people will find more comfort when they rest with their spine in a more neutral position, neither too rounded nor too arched. Lying on a non-sagging bed on your side with a pillow between your knees, or on your back with a pillow under your knees will often provide some relief. Keep in mind, being in any one position for a prolonged period of time, no matter how correct your posture, can still lead to stiffness. WALKING Walk with an upright posture. Your ears, shoulders, and hips should all line up. OFFICE WORK When working at a desk, create an environment that supports good, upright posture. Without extra support, muscles fatigue and lead to excessive strain on joints and other tissues. CHAIR:  A chair should be able to slide under your desk when your back makes contact with the back of the chair. This allows you to work closely.  The chair's height should allow your eyes to be level with the upper part of your monitor and your hands to be slightly lower than your elbows.  Body position:  Your feet should make contact with the floor. If this is not  possible, use a foot rest.  Keep your ears over your shoulders. This will reduce stress on your neck and low back.   This information is not intended to replace advice given to you by your health care provider. Make sure you discuss any questions you have with your health care provider.   Document Released: 12/08/2005 Document Revised: 12/29/2014 Document Reviewed: 03/22/2009 Elsevier Interactive Patient Education 2016 [employer]  Hip Pain  Your hip is the joint between your upper legs and your lower pelvis. The bones, cartilage, tendons, and muscles of your hip joint perform a lot of work each day supporting your body weight and allowing you to move around.   Hip pain can range from a minor ache to severe pain in one or both of your hips. Pain may be felt on the inside of the hip joint near the groin, or the outside near the buttocks and upper thigh. You may have swelling or stiffness as well.  HOME CARE INSTRUCTIONS  Take medicines only as  directed by your health care provider.  Apply ice to the injured area:  Put ice in a plastic bag.  Place a towel between your skin and the bag.  Leave the ice on for 15-20 minutes at a time, 3-4 times a day. Keep your leg raised (elevated) when possible to lessen swelling.  Avoid activities that cause pain.  Follow specific exercises as directed by your health care provider.  Sleep with a pillow between your legs on your most comfortable side.  Record how often you have hip pain, the location of the pain, and what it feels like. SEEK MEDICAL CARE IF:  You are unable to put weight on your leg.  Your hip is red or swollen or very tender to touch.  Your pain or swelling continues or worsens after 1 week.  You have increasing difficulty walking.  You have a fever. SEEK IMMEDIATE MEDICAL CARE IF:  You have fallen.  You have a sudden increase in pain and swelling in your hip. MAKE SURE YOU:  Understand these instructions.  Will watch your  condition.  Will get help right away if you are not doing well or get worse. This information is not intended to replace advice given to you by your health care provider. Make sure you discuss any questions you have with your health care provider.  Document Released: 05/28/2010 Document Revised: 12/29/2014 Document Reviewed: 08/04/2013  Elsevier Interactive Patient Education [employer].

## 2015-11-25 IMAGING — CR DG HIP (WITH OR WITHOUT PELVIS) 2-3V*R*
3 series · 3 of 3 positions shown · non-contrast
Comparison: 03/23/2014

CLINICAL DATA: Acute right hip pain for 2-3 days.

EXAM:
DG HIP (WITH OR WITHOUT PELVIS) 2-3V RIGHT

[t pelvis ap]
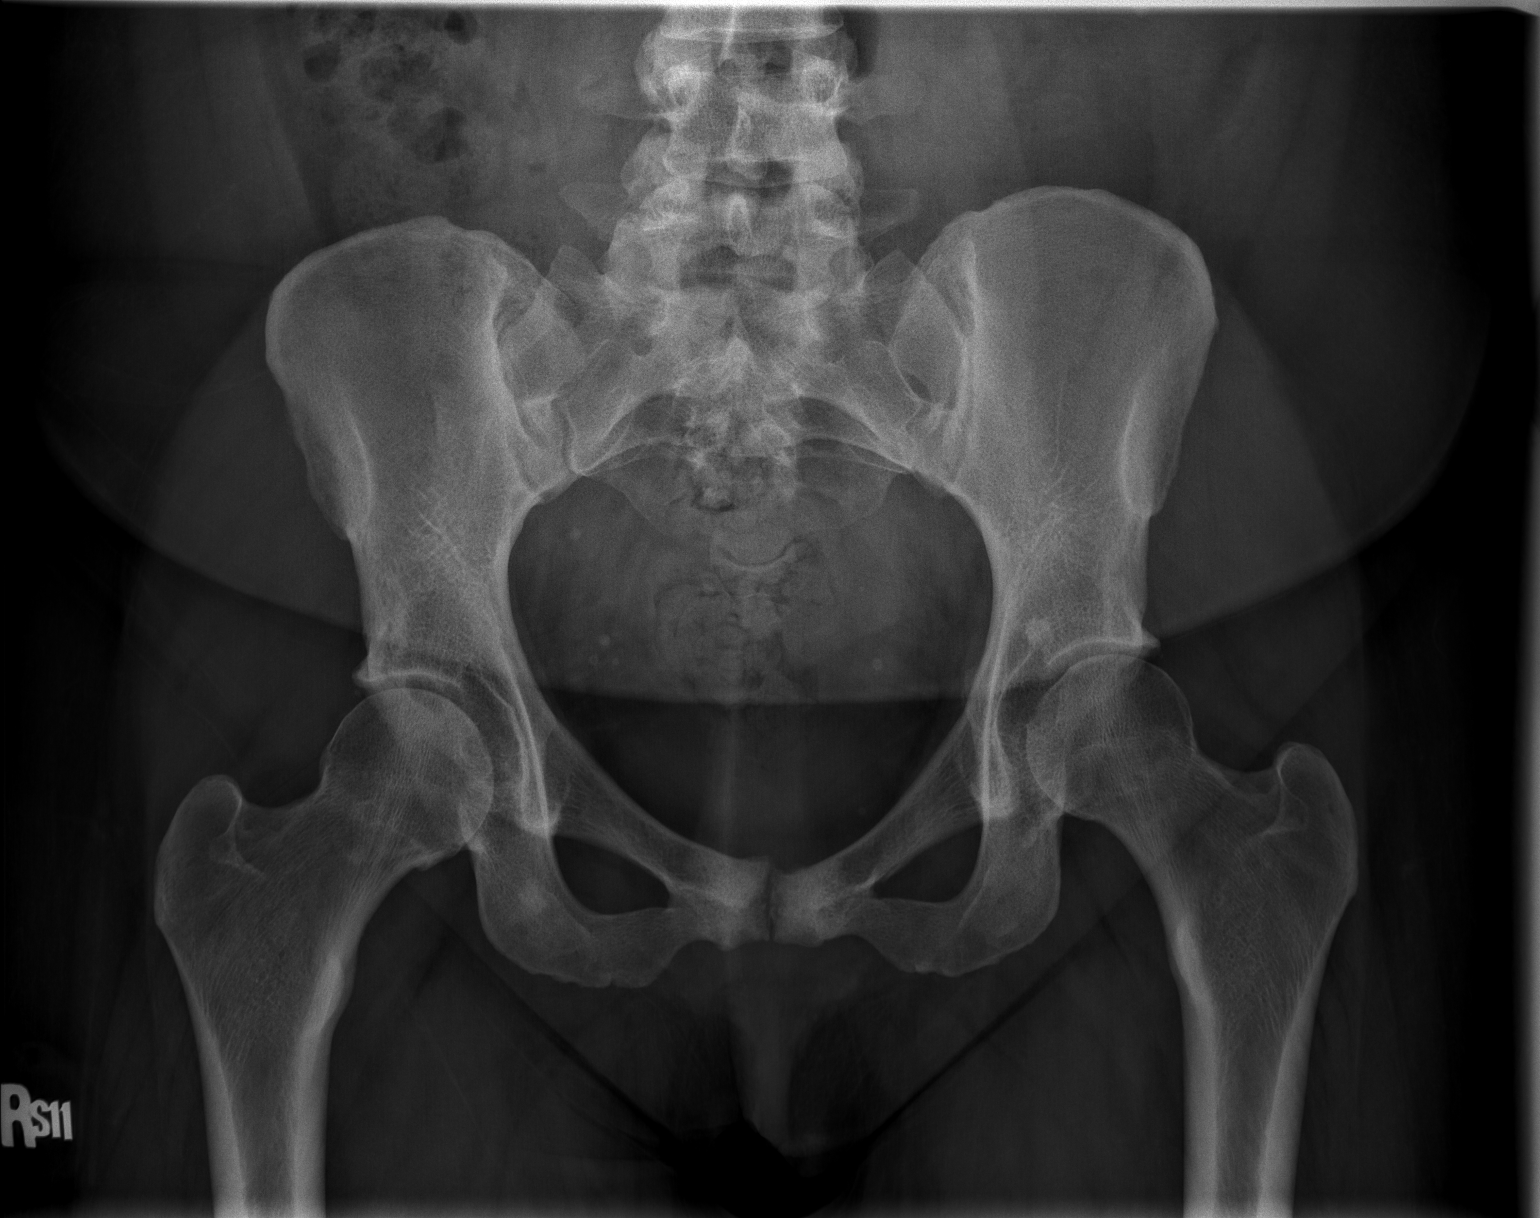

[t hip ap right]
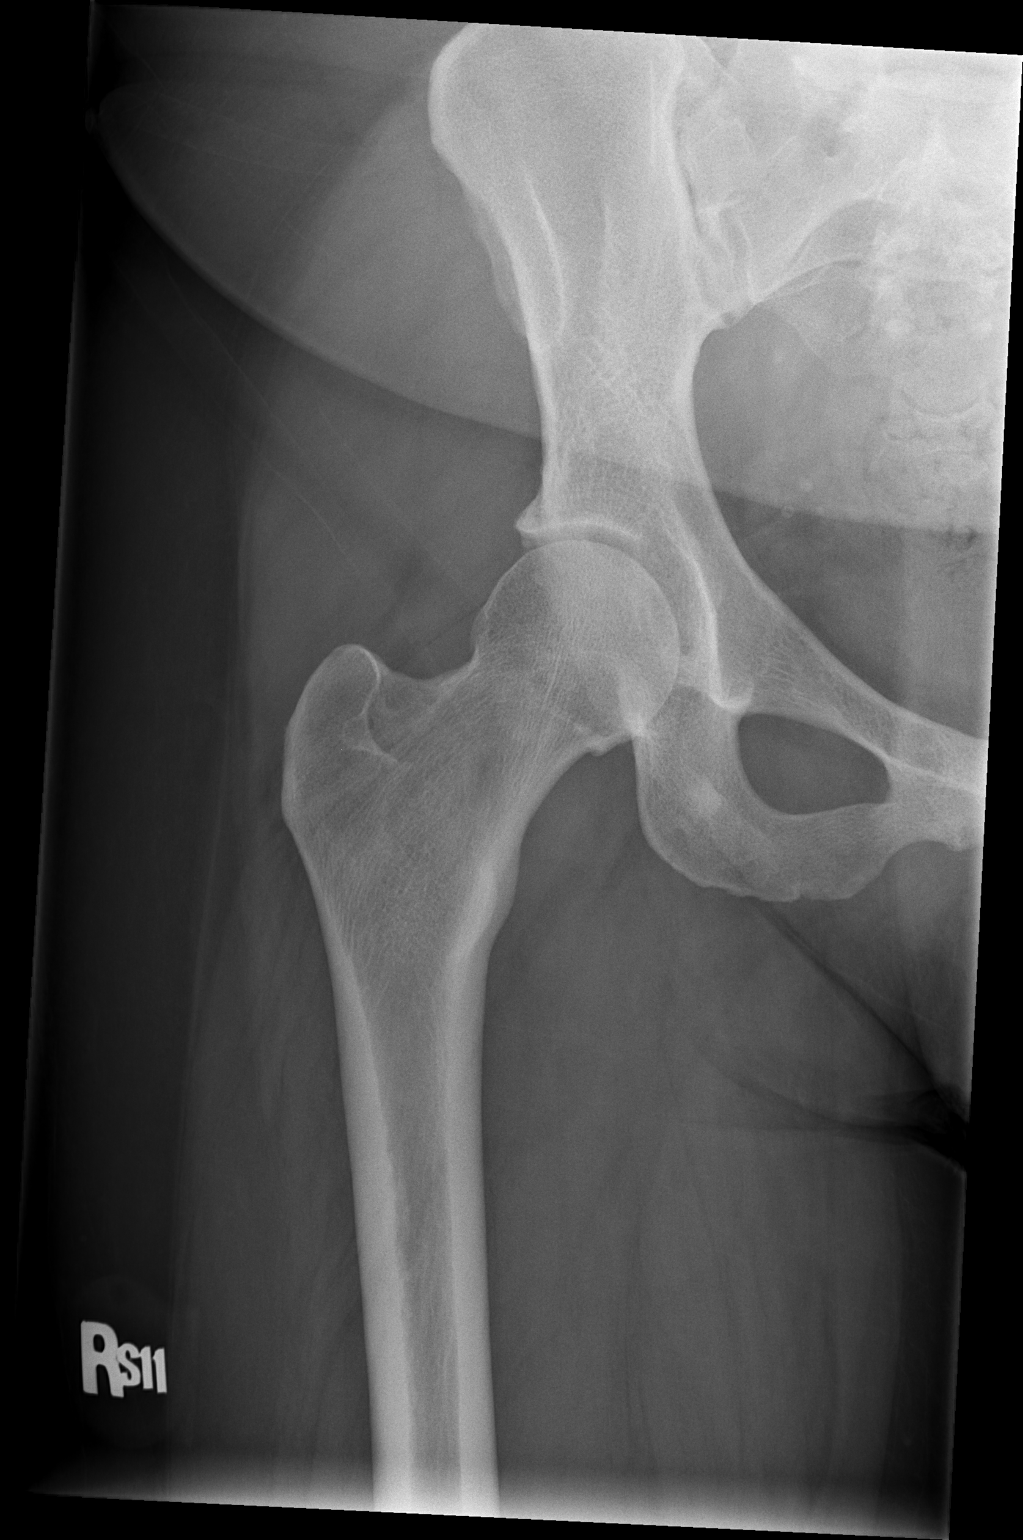

[t hip frog leg right]
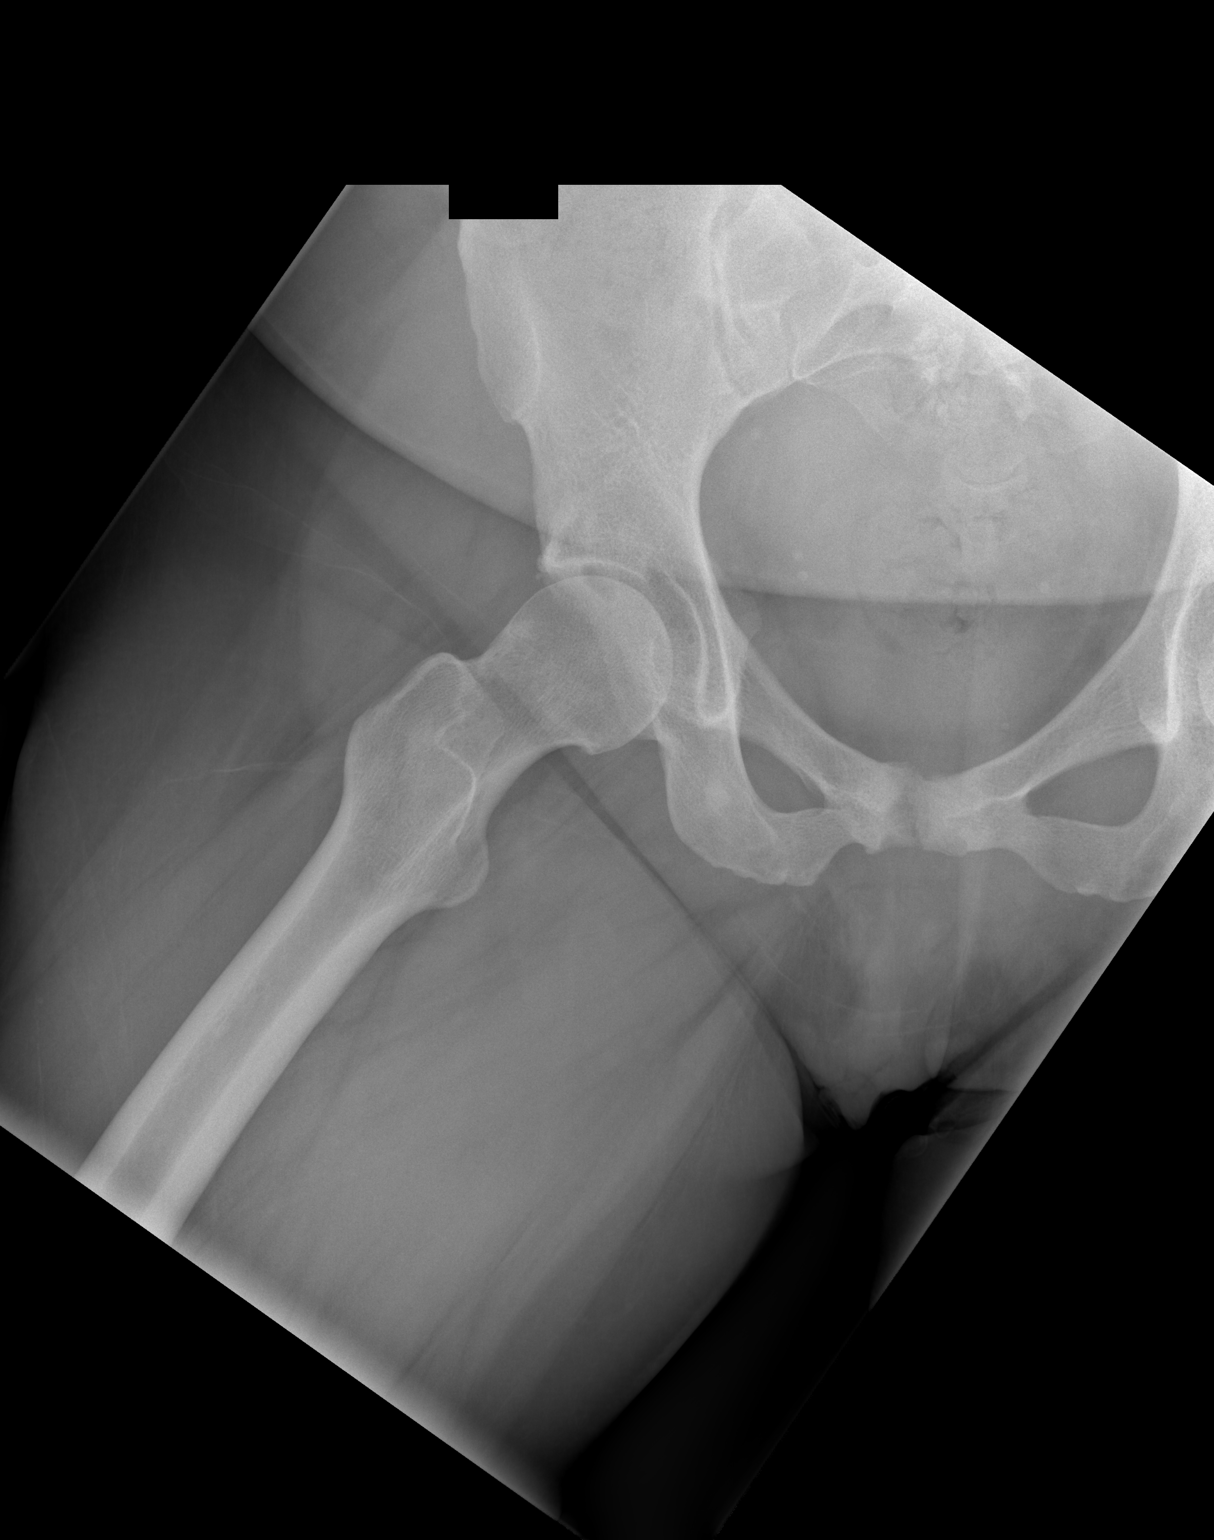

[3 of 3 positions shown; findings below may reference images not displayed]

FINDINGS: Normal right hip alignment without subluxation, dislocation, acute
osseous finding or fracture. Sclerotic bone islands present in the
right ischial tuberosity and the left superior acetabular margin.
Normal SI joints. Left hip appears unremarkable. Sclerosis of the
symphysis pubis present bilaterally compatible with osteitis pubis,
mild-to-moderate. Normal bowel gas pattern.
IMPRESSION: No acute finding of the right hip by plain radiography.

Mild-to-moderate midline osteitis pubis.

## 2016-01-03 ENCOUNTER — Emergency Department (HOSPITAL_COMMUNITY)
Admission: EM | Admit: 2016-01-03 | Discharge: 2016-01-03 | Disposition: A | Discharge status: Home / Self Care | Payer: Medicaid Other | Source: Home / Self Care | Attending: Family Medicine | Admitting: Family Medicine

## 2016-01-03 ENCOUNTER — Encounter (HOSPITAL_COMMUNITY): Payer: Self-pay | Admitting: *Deleted

## 2016-01-03 ENCOUNTER — Other Ambulatory Visit (HOSPITAL_COMMUNITY): Admission: AD | Admit: 2016-01-03 | Payer: Self-pay | Source: Ambulatory Visit | Admitting: Family Medicine

## 2016-01-03 DIAGNOSIS — J069 Acute upper respiratory infection, unspecified: Secondary | ICD-10-CM

## 2016-01-03 MED ORDER — IPRATROPIUM BROMIDE 0.06 % NA SOLN: 2.0000 | Freq: Four times a day (QID) | NASAL | Status: DC

## 2016-01-03 NOTE — Discharge Instructions (Signed)
Drink plenty of fluids as discussed, use medicine as prescribed, and mucinex or delsym for cough. Return or see your doctor if further problems °

## 2016-01-03 NOTE — ED Notes (Signed)
Pt  Reports   Symptoms  Of  sorethroat  Pain  When  She  Swallows        Body  Aches       Started  This   Am         pt  Is  Hoarse

## 2016-01-03 NOTE — ED Provider Notes (Signed)
CSN: [id number]     Arrival date & time 01/03/16  1931 History   First MD Initiated Contact with Patient 01/03/16 2035     Chief Complaint  Patient presents with  . Sore Throat   (Consider location/radiation/quality/duration/timing/severity/associated sxs/prior Treatment) Patient is a 45 y.o. female presenting with pharyngitis. The history is provided by the patient.  Sore Throat This is a new problem. The current episode started 6 to 12 hours ago. The problem has been gradually worsening. Pertinent negatives include no chest pain, no abdominal pain and no headaches. The symptoms are aggravated by swallowing.    Past Medical History  Diagnosis Date  . No pertinent past medical history    Past Surgical History  Procedure Laterality Date  . Shoulder surgery  06/2011    right rotator cuff  . Dilation and curettage of uterus  01/2011   Family History  Problem Relation Age of Onset  . Anesthesia problems Neg Hx   . Diabetes Mother   . Diabetes Father    Social History  Substance Use Topics  . Smoking status: Never Smoker   . Smokeless tobacco: Never Used  . Alcohol Use: No   OB History    Gravida Para Term Preterm AB TAB SAB Ectopic Multiple Living   4 4 4  0 0 0 0 0 0 4     Review of Systems  Constitutional: Negative.   HENT: Positive for congestion, postnasal drip, sore throat and voice change.   Respiratory: Negative.   Cardiovascular: Negative.  Negative for chest pain.  Gastrointestinal: Negative.  Negative for abdominal pain.  Neurological: Negative for headaches.  All other systems reviewed and are negative.   Allergies  Eggs or egg-derived products; Onion; and Tomato  Home Medications   Prior to Admission medications   Medication Sig Start Date End Date Taking? Authorizing Provider  ibuprofen (ADVIL,MOTRIN) 800 MG tablet Take 1 tablet (800 mg total) by mouth 3 (three) times daily. 11/13/15   Kayla Rose, PA-C  ipratropium (ATROVENT) 0.06 % nasal spray Place 2  sprays into both nostrils 4 (four) times daily. 01/03/16   Billy Fischer, MD  metroNIDAZOLE (FLAGYL) 500 MG tablet Take 1 tablet (500 mg total) by mouth 2 (two) times daily. 08/09/15   Osborne Oman, MD  Multiple Vitamins-Iron (MULTIVITAMINS WITH IRON) TABS tablet Take 1 tablet by mouth daily.    Historical Provider, MD  naproxen (NAPROSYN) 500 MG tablet Take 1 tablet (500 mg total) by mouth 2 (two) times daily with a meal. As needed for pain 08/08/15   Osborne Oman, MD   Meds Ordered and Administered this Visit  Medications - No data to display  BP 110/72 mmHg  Pulse 78  Temp(Src) 98.6 F (37 C) (Oral)  Resp 18  SpO2 99%  LMP  (Within Months) No data found.   Physical Exam  Constitutional: She is oriented to person, place, and time. She appears well-developed and well-nourished.  HENT:  Right Ear: External ear normal.  Left Ear: External ear normal.  Mouth/Throat: Uvula is midline and mucous membranes are normal. No uvula swelling. Posterior oropharyngeal edema and posterior oropharyngeal erythema present. No oropharyngeal exudate.  Neck: Normal range of motion. Neck supple.  Lymphadenopathy:    She has no cervical adenopathy.  Neurological: She is alert and oriented to person, place, and time.  Skin: Skin is warm and dry.  Nursing note and vitals reviewed.   ED Course  Procedures (including critical care time)  Labs Review  Labs Reviewed - No data to display  Imaging Review No results found.   Visual Acuity Review  Right Eye Distance:   Left Eye Distance:   Bilateral Distance:    Right Eye Near:   Left Eye Near:    Bilateral Near:         MDM   1. URI (upper respiratory infection)        Billy Fischer, MD 01/03/16 2047

## 2016-01-05 ENCOUNTER — Other Ambulatory Visit (HOSPITAL_COMMUNITY): Admission: AD | Admit: 2016-01-05 | Payer: Self-pay | Source: Ambulatory Visit | Admitting: Family Medicine

## 2016-01-05 ENCOUNTER — Encounter (HOSPITAL_COMMUNITY): Payer: Self-pay | Admitting: *Deleted

## 2016-01-05 ENCOUNTER — Other Ambulatory Visit (HOSPITAL_COMMUNITY)
Admission: RE | Admit: 2016-01-05 | Discharge: 2016-01-05 | Disposition: A | Discharge status: Home / Self Care | Payer: Medicaid Other | Source: Ambulatory Visit | Attending: Family Medicine | Admitting: Family Medicine

## 2016-01-05 ENCOUNTER — Emergency Department (INDEPENDENT_AMBULATORY_CARE_PROVIDER_SITE_OTHER)
Admission: EM | Admit: 2016-01-05 | Discharge: 2016-01-05 | Disposition: A | Discharge status: Home / Self Care | Payer: Medicaid Other | Source: Home / Self Care | Attending: Family Medicine | Admitting: Family Medicine

## 2016-01-05 DIAGNOSIS — J040 Acute laryngitis: Secondary | ICD-10-CM | POA: Insufficient documentation

## 2016-01-05 LAB — POCT RAPID STREP A: STREPTOCOCCUS, GROUP A SCREEN (DIRECT): NEGATIVE

## 2016-01-05 MED ORDER — AZITHROMYCIN 250 MG PO TABS: ORAL_TABLET | ORAL | Status: DC

## 2016-01-05 MED ORDER — PREDNISONE 50 MG PO TABS: ORAL_TABLET | ORAL | Status: DC

## 2016-01-05 NOTE — ED Provider Notes (Signed)
CSN: [id number]     Arrival date & time 01/05/16  1448 History   First MD Initiated Contact with Patient 01/05/16 1657     Chief Complaint  Patient presents with  . Sore Throat   (Consider location/radiation/quality/duration/timing/severity/associated sxs/prior Treatment) Patient is a 45 y.o. female presenting with URI. The history is provided by the patient and a relative.  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 No pertinent past medical history    Past Surgical History  Procedure Laterality Date  . Shoulder surgery  06/2011    right rotator cuff  . Dilation and curettage of uterus  01/2011   Family History  Problem Relation Age of Onset  . Anesthesia problems Neg Hx   . Diabetes Mother   . Diabetes Father    Social History  Substance Use Topics  . Smoking status: Never Smoker   . Smokeless tobacco: Never Used  . Alcohol Use: No   OB History    Gravida Para Term Preterm AB TAB SAB Ectopic Multiple Living   4 4 4  0 0 0 0 0 0 4     Review of Systems  Constitutional: Negative.  Negative for fever.  HENT: Positive for congestion, postnasal drip, rhinorrhea, sore throat and voice change.   Respiratory: Positive for cough. Negative for wheezing.   Cardiovascular: Negative.   Gastrointestinal: Negative.   Musculoskeletal: Negative.   All other systems reviewed and are negative.   Allergies  Eggs or egg-derived products; Onion; and Tomato  Home Medications   Prior to Admission medications   Medication Sig Start Date End Date Taking? Authorizing  Provider  azithromycin (ZITHROMAX Z-PAK) 250 MG tablet Take as directed on pack 01/05/16   Billy Fischer, MD  ibuprofen (ADVIL,MOTRIN) 800 MG tablet Take 1 tablet (800 mg total) by mouth 3 (three) times daily. 11/13/15   Kayla Rose, PA-C  ipratropium (ATROVENT) 0.06 % nasal spray Place 2 sprays into both nostrils 4 (four) times daily. 01/03/16   Billy Fischer, MD  metroNIDAZOLE (FLAGYL) 500 MG tablet Take 1 tablet (500 mg total) by mouth 2 (two) times daily. 08/09/15   Osborne Oman, MD  Multiple Vitamins-Iron (MULTIVITAMINS WITH IRON) TABS tablet Take 1 tablet by mouth daily.    Historical Provider, MD  naproxen (NAPROSYN) 500 MG tablet Take 1 tablet (500 mg total) by mouth 2 (two) times daily with a meal. As needed for pain 08/08/15   Osborne Oman, MD  predniSONE (DELTASONE) 50 MG tablet 1 tab daily for 2 days then 1/2 tab daily for 2 days. 01/05/16   Billy Fischer, MD   Meds Ordered and Administered this Visit  Medications - No data to display  BP 110/72 mmHg  Pulse 81  Temp(Src) 98.7 F (37.1 C) (Oral)  Resp 18  SpO2 100%  LMP  (Within Months) No data found.   Physical Exam  Constitutional: She is oriented to person, place, and time. She appears well-developed and well-nourished.  No voice.  HENT:  Right Ear: External ear normal.  Left Ear: External ear normal.  Mouth/Throat: Oropharynx is clear and moist.  Eyes: Pupils are equal, round, and reactive to light.  Neck: Normal range of motion. Neck supple.  Cardiovascular: Regular rhythm, normal heart sounds and intact distal pulses.   Pulmonary/Chest: Effort normal and breath sounds normal.  Lymphadenopathy:    She has no cervical adenopathy.  Neurological: She is alert and oriented to person, place, and time.  Skin: Skin is warm and dry.  Nursing note and vitals reviewed.   ED Course  Procedures (including critical care time)  Labs Review Labs Reviewed  POCT RAPID STREP A   Strep neg.  Imaging Review No results  found.   Visual Acuity Review  Right Eye Distance:   Left Eye Distance:   Bilateral Distance:    Right Eye Near:   Left Eye Near:    Bilateral Near:         MDM   1. Laryngitis, acute        Billy Fischer, MD 01/05/16 1747

## 2016-01-05 NOTE — Discharge Instructions (Signed)
Drink plenty of fluids as discussed, use medicine as prescribed, and mucinex or delsym for cough. Return or see your doctor if further problems °

## 2016-01-05 NOTE — ED Notes (Signed)
pT  REPORTS  SYMPTOMS  OF  SORETHROAT     AND  CONGESTION    SYMPTOMS  X  2   DAYS      PT  SITTING  UPRIGHT ON  THE  EXAM TABLE  SPEAKING  IN  COMPLETE  SENTANCES

## 2016-01-08 LAB — CULTURE, GROUP A STREP (THRC)

## 2016-04-21 ENCOUNTER — Encounter (HOSPITAL_COMMUNITY): Payer: Self-pay | Admitting: Emergency Medicine

## 2016-04-21 ENCOUNTER — Emergency Department (HOSPITAL_COMMUNITY)
Admission: EM | Admit: 2016-04-21 | Discharge: 2016-04-22 | Disposition: A | Discharge status: Home / Self Care | Payer: Medicaid Other | Attending: Emergency Medicine | Admitting: Emergency Medicine

## 2016-04-21 ENCOUNTER — Emergency Department (HOSPITAL_COMMUNITY): Payer: Medicaid Other

## 2016-04-21 DIAGNOSIS — J069 Acute upper respiratory infection, unspecified: Secondary | ICD-10-CM

## 2016-04-21 DIAGNOSIS — R079 Chest pain, unspecified: Secondary | ICD-10-CM

## 2016-04-21 DIAGNOSIS — Z3202 Encounter for pregnancy test, result negative: Secondary | ICD-10-CM | POA: Insufficient documentation

## 2016-04-21 DIAGNOSIS — Z792 Long term (current) use of antibiotics: Secondary | ICD-10-CM | POA: Insufficient documentation

## 2016-04-21 DIAGNOSIS — Z79899 Other long term (current) drug therapy: Secondary | ICD-10-CM | POA: Insufficient documentation

## 2016-04-21 DIAGNOSIS — R55 Syncope and collapse: Secondary | ICD-10-CM

## 2016-04-21 DIAGNOSIS — Z791 Long term (current) use of non-steroidal anti-inflammatories (NSAID): Secondary | ICD-10-CM | POA: Insufficient documentation

## 2016-04-21 LAB — CBC
HEMATOCRIT: 34.5 % — AB (ref 36.0–46.0)
Hemoglobin: 11.8 g/dL — ABNORMAL LOW (ref 12.0–15.0)
MCH: 30.4 pg (ref 26.0–34.0)
MCHC: 34.2 g/dL (ref 30.0–36.0)
MCV: 88.9 fL (ref 78.0–100.0)
Platelets: 227 10*3/uL (ref 150–400)
RBC: 3.88 MIL/uL (ref 3.87–5.11)
RDW: 12.6 % (ref 11.5–15.5)
WBC: 3.4 10*3/uL — AB (ref 4.0–10.5)

## 2016-04-21 MED ORDER — ONDANSETRON 4 MG PO TBDP
4.0000 mg | ORAL_TABLET | Freq: Once | ORAL | Status: AC | PRN
  Administered 2016-04-21: 4 mg via ORAL
  Filled 2016-04-21: qty 1

## 2016-04-21 NOTE — ED Notes (Signed)
Pt reports being at home and heading back to bed and experiencing a syncopal episode. Pt's daughter and son are at bedside.  They state pt has had cold like symptoms for a few days now leading up to this.  Pt states she has had this happen to her before but "it has been a while".  Pt appears drowsy and withdrawn.  Pt states "I'm tired".  Daughter and son say this is pt's baseline.  Pt reports having pain in her stomach and chest.  12-lead completed.

## 2016-04-22 LAB — BASIC METABOLIC PANEL
Anion gap: 6 (ref 5–15)
BUN: 13 mg/dL (ref 6–20)
CO2: 26 mmol/L (ref 22–32)
Calcium: 9.1 mg/dL (ref 8.9–10.3)
Chloride: 107 mmol/L (ref 101–111)
Creatinine, Ser: 0.85 mg/dL (ref 0.44–1.00)
GFR calc Af Amer: >60 mL/min (ref >60)
GFR calc non Af Amer: >60 mL/min (ref >60)
GLUCOSE: 111 mg/dL — AB (ref 65–99)
POTASSIUM: 4.1 mmol/L (ref 3.5–5.1)
Sodium: 139 mmol/L (ref 135–145)

## 2016-04-22 LAB — URINALYSIS, ROUTINE W REFLEX MICROSCOPIC
Bilirubin Urine: NEGATIVE
GLUCOSE, UA: NEGATIVE mg/dL
Ketones, ur: NEGATIVE mg/dL
LEUKOCYTES UA: NEGATIVE
Nitrite: NEGATIVE
PH: 5.5 (ref 5.0–8.0)
Protein, ur: NEGATIVE mg/dL
Specific Gravity, Urine: 1.031 — ABNORMAL HIGH (ref 1.005–1.030)

## 2016-04-22 LAB — URINE MICROSCOPIC-ADD ON: BACTERIA UA: NONE SEEN

## 2016-04-22 LAB — I-STAT BETA HCG BLOOD, ED (MC, WL, AP ONLY): I-stat hCG, quantitative: <5 m[IU]/mL (ref <5)

## 2016-04-22 LAB — I-STAT TROPONIN, ED: TROPONIN I, POC: 0 ng/mL (ref 0.00–0.08)

## 2016-04-22 LAB — LIPASE, BLOOD: Lipase: 14 U/L (ref 11–51)

## 2016-04-22 LAB — CBG MONITORING, ED: GLUCOSE-CAPILLARY: 123 mg/dL — AB (ref 65–99)

## 2016-04-22 MED ORDER — IPRATROPIUM-ALBUTEROL 0.5-2.5 (3) MG/3ML IN SOLN
3.0000 mL | Freq: Once | RESPIRATORY_TRACT | Status: AC
  Administered 2016-04-22: 3 mL via RESPIRATORY_TRACT
  Filled 2016-04-22: qty 3

## 2016-04-22 MED ORDER — ONDANSETRON HCL 4 MG PO TABS: 4.0000 mg | ORAL_TABLET | Freq: Four times a day (QID) | ORAL | Status: DC

## 2016-04-22 MED ORDER — BENZONATATE 100 MG PO CAPS
100.0000 mg | ORAL_CAPSULE | Freq: Once | ORAL | Status: AC
  Administered 2016-04-22: 100 mg via ORAL
  Filled 2016-04-22: qty 1

## 2016-04-22 MED ORDER — BENZONATATE 100 MG PO CAPS: 100.0000 mg | ORAL_CAPSULE | Freq: Three times a day (TID) | ORAL | Status: DC

## 2016-04-22 MED ORDER — SODIUM CHLORIDE 0.9 % IV BOLUS (SEPSIS)
1000.0000 mL | Freq: Once | INTRAVENOUS | Status: AC
  Administered 2016-04-22: 1000 mL via INTRAVENOUS

## 2016-04-22 NOTE — ED Notes (Signed)
PA at bedside.

## 2016-04-22 NOTE — ED Provider Notes (Signed)
CSN: [id number]     Arrival date & time 04/21/16  2257 History   First MD Initiated Contact with Patient 04/22/16 0145     Chief Complaint  Patient presents with  . Loss of Consciousness     HPI   45 year old female presents today with complaints of generalized weakness and syncope. Patient notes that approximately 4 days ago she began to experience generalized weakness, sore throat, coughing, wheezing and tightness of her chest. Patient reports yesterday she had fever prior to arrival to the emergency room. She notes that the generalized weakness has increased, not localized to the point yesterday when she was walking to her bed around 9 PM she had a syncopal episode. Patient denies any chest pain, shortness of breath, preceding headache. She reports waking up on the floor with her husband who helped her into bed. Patient notes one episode of vomiting additionally yesterday. Patient has last dose of ibuprofen was yesterday morning. She denies any focal neurological deficits, reports that she has been able to tolerate by mouth.   Past Medical History  Diagnosis Date  . No pertinent past medical history    Past Surgical History  Procedure Laterality Date  . Shoulder surgery  06/2011    right rotator cuff  . Dilation and curettage of uterus  01/2011   Family History  Problem Relation Age of Onset  . Anesthesia problems Neg Hx   . Diabetes Mother   . Diabetes Father    Social History  Substance Use Topics  . Smoking status: Never Smoker   . Smokeless tobacco: Never Used  . Alcohol Use: No   OB History    Gravida Para Term Preterm AB TAB SAB Ectopic Multiple Living   4 4 4  0 0 0 0 0 0 4     Review of Systems  All other systems reviewed and are negative.   Allergies  Eggs or egg-derived products; Onion; and Tomato  Home Medications   Prior to Admission medications   Medication Sig Start Date End Date Taking? Authorizing Provider  ibuprofen (ADVIL,MOTRIN) 800 MG tablet Take  1 tablet (800 mg total) by mouth 3 (three) times daily. 11/13/15  Yes Gloriann Loan, PA-C  Multiple Vitamins-Iron (MULTIVITAMINS WITH IRON) TABS tablet Take 1 tablet by mouth daily.   Yes Historical Provider, MD  azithromycin (ZITHROMAX Z-PAK) 250 MG tablet Take as directed on pack 01/05/16   Billy Fischer, MD  benzonatate (TESSALON) 100 MG capsule Take 1 capsule (100 mg total) by mouth every 8 (eight) hours. 04/22/16   Okey Regal, PA-C  ipratropium (ATROVENT) 0.06 % nasal spray Place 2 sprays into both nostrils 4 (four) times daily. 01/03/16   Billy Fischer, MD  metroNIDAZOLE (FLAGYL) 500 MG tablet Take 1 tablet (500 mg total) by mouth 2 (two) times daily. 08/09/15   Osborne Oman, MD  naproxen (NAPROSYN) 500 MG tablet Take 1 tablet (500 mg total) by mouth 2 (two) times daily with a meal. As needed for pain 08/08/15   Laray Anger A Anyanwu, MD  ondansetron (ZOFRAN) 4 MG tablet Take 1 tablet (4 mg total) by mouth every 6 (six) hours. 04/22/16   Okey Regal, PA-C  predniSONE (DELTASONE) 50 MG tablet 1 tab daily for 2 days then 1/2 tab daily for 2 days. 01/05/16   Billy Fischer, MD   BP 110/78 mmHg  Pulse 69  Temp(Src) 98.2 F (36.8 C) (Oral)  Resp 15  Ht 5\' 3"  (1.6 m)  Wt 77.111 kg  BMI 30.12 kg/m2  SpO2 100%  LMP 03/31/2016 (Exact Date)   Physical Exam  Constitutional: She is oriented to person, place, and time. She appears well-developed and well-nourished.  HENT:  Head: Normocephalic and atraumatic.  Eyes: Conjunctivae are normal. Pupils are equal, round, and reactive to light. Right eye exhibits no discharge. Left eye exhibits no discharge. No scleral icterus.  Neck: Normal range of motion. No JVD present. No tracheal deviation present.  Cardiovascular: Normal rate, regular rhythm, normal heart sounds and intact distal pulses.  Exam reveals no gallop and no friction rub.   No murmur heard. Pulmonary/Chest: Effort normal and breath sounds normal. No stridor. No respiratory distress. She has  no wheezes. She has no rales. She exhibits no tenderness.  Abdominal: Soft. She exhibits no distension and no mass. There is no tenderness. There is no rebound and no guarding.  Musculoskeletal: Normal range of motion. She exhibits no edema or tenderness.  Neurological: She is alert and oriented to person, place, and time. She has normal strength. No cranial nerve deficit or sensory deficit. Coordination and gait normal. GCS eye subscore is 4. GCS verbal subscore is 5. GCS motor subscore is 6.  Skin: Skin is warm and dry. No rash noted. No erythema. No pallor.  Psychiatric: She has a normal mood and affect. Her behavior is normal. Judgment and thought content normal.  Nursing note and vitals reviewed.   ED Course  Procedures (including critical care time) Labs Review Labs Reviewed  BASIC METABOLIC PANEL - Abnormal; Notable for the following:    Glucose, Bld 111 (*)    All other components within normal limits  CBC - Abnormal; Notable for the following:    WBC 3.4 (*)    Hemoglobin 11.8 (*)    HCT 34.5 (*)    All other components within normal limits  URINALYSIS, ROUTINE W REFLEX MICROSCOPIC (NOT AT [facility]) - Abnormal; Notable for the following:    APPearance CLOUDY (*)    Specific Gravity, Urine 1.031 (*)    Hgb urine dipstick MODERATE (*)    All other components within normal limits  URINE MICROSCOPIC-ADD ON - Abnormal; Notable for the following:    Squamous Epithelial / LPF 6-30 (*)    All other components within normal limits  CBG MONITORING, ED - Abnormal; Notable for the following:    Glucose-Capillary 123 (*)    All other components within normal limits  LIPASE, BLOOD  I-STAT BETA HCG BLOOD, ED ([facility], [facility], [facility] ONLY)  I-STAT TROPOININ, ED    Imaging Review Dg Chest 2 View  04/21/2016  CLINICAL DATA:  Fatigue.  Syncope. EXAM: CHEST  2 VIEW COMPARISON:  05/15/2014 FINDINGS: Marked thoracolumbar scoliosis, unchanged. The lungs are clear. The pulmonary vasculature is normal. Heart  size is normal. Hilar and mediastinal contours are unremarkable. There is no pleural effusion. IMPRESSION: No active cardiopulmonary disease. Electronically Signed   By: Andreas Newport M.D.   On: 04/21/2016 23:58   I have personally reviewed and evaluated these images and lab results as part of my medical decision-making.   EKG Interpretation None      MDM   Final diagnoses:  Syncope, unspecified syncope type  URI (upper respiratory infection)    Labs:Urinalysis, i-STAT beta hCG, i-STAT troponin, BMP, CBC, lipase, 20 care CBG  Imaging: EKG chest 2 view  Consults:  Therapeutics:DuoNeb, normal saline, Zofran  Discharge Meds: Zofran, Tessalon  Assessment/Plan:45 year old female presents today with reported syncope generalized weakness. Patient's symptoms are most consistent with  viral upper respiratory infection. Patient has reported cough, none heard here in the ED. She reports fever, she is afebrile. Her vital signs are reassuring and normal, she has no significant findings on laboratory analysis, or diagnostic imaging that would indicate any other pathology. Patient has no neurological deficits that would necessitate further evaluation for underlying etiology. Patient likely viral illness causing increased weakness and episode of syncope. Patient had no preceding symptoms that would indicate cardiac or neurologic cause. Patient reports improvement after normal saline here in ED.Patient was ambulated here in the ED with steady gait and no concerning signs or symptoms. She will be discharged home with cough medication, close follow-up with her primary care provider. Patient is given strict return caution's, she verbalized understanding and agreement today's plan had no further questions or concerns at the time of discharge.        Okey Regal, PA-C 04/22/16 [place], MD 04/23/16 [phone]

## 2016-07-30 ENCOUNTER — Other Ambulatory Visit: Payer: Self-pay | Admitting: Obstetrics & Gynecology

## 2016-07-30 DIAGNOSIS — Z1231 Encounter for screening mammogram for malignant neoplasm of breast: Secondary | ICD-10-CM

## 2016-08-20 ENCOUNTER — Ambulatory Visit: Payer: Medicaid Other

## 2017-10-27 ENCOUNTER — Inpatient Hospital Stay (HOSPITAL_COMMUNITY)
Admission: AD | Admit: 2017-10-27 | Discharge: 2017-10-27 | Disposition: A | Discharge status: Home / Self Care | Payer: Self-pay | Source: Ambulatory Visit | Attending: Obstetrics & Gynecology | Admitting: Obstetrics & Gynecology

## 2017-10-27 ENCOUNTER — Encounter (HOSPITAL_COMMUNITY): Payer: Self-pay | Admitting: *Deleted

## 2017-10-27 DIAGNOSIS — N912 Amenorrhea, unspecified: Secondary | ICD-10-CM

## 2017-10-27 DIAGNOSIS — Z79899 Other long term (current) drug therapy: Secondary | ICD-10-CM | POA: Insufficient documentation

## 2017-10-27 DIAGNOSIS — Z3202 Encounter for pregnancy test, result negative: Secondary | ICD-10-CM

## 2017-10-27 LAB — POCT PREGNANCY, URINE: Preg Test, Ur: NEGATIVE

## 2017-10-27 LAB — URINALYSIS, ROUTINE W REFLEX MICROSCOPIC
Bilirubin Urine: NEGATIVE
GLUCOSE, UA: NEGATIVE mg/dL
HGB URINE DIPSTICK: NEGATIVE
Ketones, ur: NEGATIVE mg/dL
NITRITE: NEGATIVE
Protein, ur: NEGATIVE mg/dL
RBC / HPF: NONE SEEN RBC/hpf (ref 0–5)
SPECIFIC GRAVITY, URINE: 1.017 (ref 1.005–1.030)
pH: 8 (ref 5.0–8.0)

## 2017-10-27 LAB — HCG, SERUM, QUALITATIVE: Preg, Serum: NEGATIVE

## 2017-10-27 NOTE — MAU Note (Signed)
Pt reports she has not had a period since July, states she has been having preg symptoms (nausea, tired, lower abd pain, vaginal discharge). Has had one negative home test and 2 ? Positive.

## 2017-10-27 NOTE — Discharge Instructions (Signed)
In late 2019, the [facility] will be moving to the [place]. At that time, the MAU will no longer serve non-pregnant patients. We encourage you to establish care with a provider before that time, so that you can be seen with any GYN concerns, like vaginal discharge, urinary tract infection, etc.. in a timely manner. In order to make the office visit more convenient, the [facility] [place] at [facility] will be offering evening hours from 4pm-7:30pm on Mondays starting 08/31/17. There will be same-day appointments, walk-in appointments and scheduled appointments available during this time.    [facility] [place] [facility] [phone]  For urgent needs, [facility] is also available for management of urgent GYN complaints such as vaginal discharge.   [employer] extends eligibility for family planning services to reduce unintended pregnancies and improve the well-being of children and families.   Eligible individuals whose income is at or below 195% of the federal poverty level and who are:  - [place] citizens, documented immigrants or qualified aliens;  - Residents of [place];  - Not incarcerated; and  - Not pregnant.   [employer] Family Planning Contact Information:  Medical Assistance Clinical Section Phone: [phone] Email: [email or web address]    Primary care follow up  [facility] (will see you even if you do not have sickle cell): [facility]: [place] [facility]: [phone]

## 2017-10-27 NOTE — MAU Provider Note (Signed)
History     CSN: [id number]  Arrival date and time: 10/27/17 1535   First Provider Initiated Contact with Patient 10/27/17 1619      Chief Complaint  Patient presents with  . Amenorrhea   Toni Chang is a 46 y.o. [phone] who presents today with amenorrhea. She had one negative pregnancy test at home, and one positive home. She had a BTL about 18 years ago.    Vaginal Bleeding  The patient's primary symptoms include missed menses. The patient's pertinent negatives include no pelvic pain or vaginal discharge. This is a new problem. The current episode started more than 1 month ago. The problem has been unchanged. Pain severity now: 6/10. Affected Side: lower back  She is not pregnant. Associated symptoms include back pain. Pertinent negatives include no chills, dysuria, fever, frequency, nausea, urgency or vomiting. The vaginal discharge was normal. She has tried NSAIDs for the symptoms. The treatment provided no relief. She uses tubal ligation for contraception. Her menstrual history has been irregular (LMP 06/29/17 to 07/03/17 then had bleeding again 07/18/17 to 07/21/17 prior to this time cycles were irregular ).   Past Medical History:  Diagnosis Date  . No pertinent past medical history     Past Surgical History:  Procedure Laterality Date  . DILATION AND CURETTAGE OF UTERUS  01/2011  . SHOULDER SURGERY  06/2011   right rotator cuff    Family History  Problem Relation Age of Onset  . Anesthesia problems Neg Hx   . Diabetes Mother   . Diabetes Father     Social History   Tobacco Use  . Smoking status: Never Smoker  . Smokeless tobacco: Never Used  Substance Use Topics  . Alcohol use: No  . Drug use: No    Allergies:  Allergies  Allergen Reactions  . Eggs Or Egg-Derived Products Hives  . Onion Hives  . Tomato Hives    Medications Prior to Admission  Medication Sig Dispense Refill Last Dose  . folic acid (FOLVITE) 716 MCG tablet Take 400 mcg daily by mouth.    10/27/2017 at Unknown time  . Multiple Vitamins-Iron (MULTIVITAMINS WITH IRON) TABS tablet Take 1 tablet by mouth daily.   10/27/2017 at Unknown time    Review of Systems  Constitutional: Negative for chills and fever.  Gastrointestinal: Negative for nausea and vomiting.  Genitourinary: Positive for menstrual problem, missed menses and vaginal bleeding. Negative for dysuria, frequency, pelvic pain, urgency and vaginal discharge.  Musculoskeletal: Positive for back pain.   Physical Exam   Blood pressure (!) 110/47, pulse 68, temperature 98.6 F (37 C), temperature source Oral, resp. rate 15, height 5\' 3"  (1.6 m), weight 191 lb (86.6 kg), last menstrual period 07/18/2017, SpO2 98 %.  Physical Exam  Nursing note and vitals reviewed. Constitutional: She is oriented to person, place, and time. She appears well-developed and well-nourished. No distress.  HENT:  Head: Normocephalic.  Cardiovascular: Normal rate.  Respiratory: Effort normal.  GI: Soft. There is no tenderness. There is no rebound.  Neurological: She is alert and oriented to person, place, and time.  Skin: Skin is warm and dry.  Psychiatric: She has a normal mood and affect.   Results for orders placed or performed during the hospital encounter of 10/27/17 (from the past 24 hour(s))  Urinalysis, Routine w reflex microscopic     Status: Abnormal   Collection Time: 10/27/17  3:47 PM  Result Value Ref Range   Color, Urine YELLOW YELLOW   APPearance  HAZY (A) CLEAR   Specific Gravity, Urine 1.017 1.005 - 1.030   pH 8.0 5.0 - 8.0   Glucose, UA NEGATIVE NEGATIVE mg/dL   Hgb urine dipstick NEGATIVE NEGATIVE   Bilirubin Urine NEGATIVE NEGATIVE   Ketones, ur NEGATIVE NEGATIVE mg/dL   Protein, ur NEGATIVE NEGATIVE mg/dL   Nitrite NEGATIVE NEGATIVE   Leukocytes, UA TRACE (A) NEGATIVE   RBC / HPF NONE SEEN 0 - 5 RBC/hpf   WBC, UA 0-5 0 - 5 WBC/hpf   Bacteria, UA RARE (A) NONE SEEN   Squamous Epithelial / LPF 6-30 (A) NONE SEEN    Mucus PRESENT   Pregnancy, urine POC     Status: None   Collection Time: 10/27/17  4:01 PM  Result Value Ref Range   Preg Test, Ur NEGATIVE NEGATIVE  hCG, serum, qualitative     Status: None   Collection Time: 10/27/17  4:52 PM  Result Value Ref Range   Preg, Serum NEGATIVE NEGATIVE    MAU Course  Procedures  MDM DW patient that we will do a serum HCG because she had a +UPT at home and negative here. Discussed that it will likely be negative, and that there are a variety of reasons for missed menstrual cycles. One factor could be related to age, and perimenopause. Will need to FU with PCP for further evaluation. Patient is agreeable with plan.  Assessment and Plan   1. Amenorrhea   2. Pregnancy examination or test, negative result    DC home RX: none  Return to MAU as needed FU with PCP as planned  [place] for [place] Follow up.   Specialty:  Obstetrics and Gynecology Contact information: [place] 10/27/2017, 5:26 PM

## 2018-06-07 ENCOUNTER — Emergency Department (HOSPITAL_COMMUNITY)
Admission: EM | Admit: 2018-06-07 | Discharge: 2018-06-07 | Disposition: A | Discharge status: Home / Self Care | Payer: Medicaid Other | Attending: Emergency Medicine | Admitting: Emergency Medicine

## 2018-06-07 ENCOUNTER — Encounter (HOSPITAL_COMMUNITY): Payer: Self-pay | Admitting: *Deleted

## 2018-06-07 DIAGNOSIS — Y93E2 Activity, laundry: Secondary | ICD-10-CM | POA: Insufficient documentation

## 2018-06-07 DIAGNOSIS — T23201A Burn of second degree of right hand, unspecified site, initial encounter: Secondary | ICD-10-CM | POA: Insufficient documentation

## 2018-06-07 DIAGNOSIS — X118XXA Contact with other hot tap-water, initial encounter: Secondary | ICD-10-CM | POA: Insufficient documentation

## 2018-06-07 DIAGNOSIS — T23202A Burn of second degree of left hand, unspecified site, initial encounter: Secondary | ICD-10-CM | POA: Insufficient documentation

## 2018-06-07 DIAGNOSIS — T23299A Burn of second degree of multiple sites of unspecified wrist and hand, initial encounter: Secondary | ICD-10-CM

## 2018-06-07 DIAGNOSIS — T310 Burns involving less than 10% of body surface: Secondary | ICD-10-CM | POA: Insufficient documentation

## 2018-06-07 DIAGNOSIS — Y999 Unspecified external cause status: Secondary | ICD-10-CM | POA: Insufficient documentation

## 2018-06-07 DIAGNOSIS — Y929 Unspecified place or not applicable: Secondary | ICD-10-CM | POA: Insufficient documentation

## 2018-06-07 MED ORDER — BACITRACIN ZINC 500 UNIT/GM EX OINT
TOPICAL_OINTMENT | Freq: Two times a day (BID) | CUTANEOUS | Status: DC
  Administered 2018-06-07: 22:00:00 via TOPICAL
  Filled 2018-06-07: qty 3.6

## 2018-06-07 MED ORDER — HYDROMORPHONE HCL 1 MG/ML IJ SOLN
0.5000 mg | Freq: Once | INTRAMUSCULAR | Status: AC
  Administered 2018-06-07: 0.5 mg via INTRAVENOUS
  Filled 2018-06-07: qty 1

## 2018-06-07 MED ORDER — OXYCODONE-ACETAMINOPHEN 5-325 MG PO TABS
1.0000 | ORAL_TABLET | Freq: Once | ORAL | Status: AC
  Administered 2018-06-07: 1 via ORAL
  Filled 2018-06-07: qty 1

## 2018-06-07 MED ORDER — KETOROLAC TROMETHAMINE 30 MG/ML IJ SOLN
30.0000 mg | Freq: Once | INTRAMUSCULAR | Status: AC
  Administered 2018-06-07: 30 mg via INTRAVENOUS
  Filled 2018-06-07: qty 1

## 2018-06-07 MED ORDER — FENTANYL CITRATE (PF) 100 MCG/2ML IJ SOLN
100.0000 ug | Freq: Once | INTRAMUSCULAR | Status: AC
  Administered 2018-06-07: 100 ug via INTRAVENOUS
  Filled 2018-06-07: qty 2

## 2018-06-07 MED ORDER — ONDANSETRON HCL 4 MG/2ML IJ SOLN
4.0000 mg | Freq: Once | INTRAMUSCULAR | Status: AC
  Administered 2018-06-07: 4 mg via INTRAVENOUS
  Filled 2018-06-07: qty 2

## 2018-06-07 MED ORDER — HYDROMORPHONE HCL 1 MG/ML IJ SOLN
1.0000 mg | Freq: Once | INTRAMUSCULAR | Status: AC
  Administered 2018-06-07: 1 mg via INTRAVENOUS
  Filled 2018-06-07: qty 1

## 2018-06-07 MED ORDER — OXYCODONE-ACETAMINOPHEN 5-325 MG PO TABS: 1.0000 | ORAL_TABLET | Freq: Three times a day (TID) | ORAL | 0 refills | Status: DC | PRN

## 2018-06-07 NOTE — ED Notes (Signed)
Pt states unable to sign for discharge due to hands
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
## 2018-06-07 NOTE — ED Notes (Signed)
Patient had a merge chart. Patients first name was in as betty. New bracelet given to patient. VS are stable.
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
## 2018-08-15 ENCOUNTER — Encounter (HOSPITAL_COMMUNITY): Payer: Self-pay | Admitting: Emergency Medicine

## 2018-08-15 ENCOUNTER — Emergency Department (HOSPITAL_COMMUNITY)
Admission: EM | Admit: 2018-08-15 | Discharge: 2018-08-15 | Disposition: A | Discharge status: Home / Self Care | Payer: Medicaid Other | Attending: Emergency Medicine | Admitting: Emergency Medicine

## 2018-08-15 ENCOUNTER — Other Ambulatory Visit: Payer: Self-pay

## 2018-08-15 DIAGNOSIS — K5900 Constipation, unspecified: Secondary | ICD-10-CM

## 2018-08-15 DIAGNOSIS — M545 Low back pain, unspecified: Secondary | ICD-10-CM

## 2018-08-15 DIAGNOSIS — E7800 Pure hypercholesterolemia, unspecified: Secondary | ICD-10-CM | POA: Insufficient documentation

## 2018-08-15 DIAGNOSIS — R5383 Other fatigue: Secondary | ICD-10-CM

## 2018-08-15 DIAGNOSIS — N939 Abnormal uterine and vaginal bleeding, unspecified: Secondary | ICD-10-CM | POA: Insufficient documentation

## 2018-08-15 LAB — CBC
HCT: 37.8 % (ref 36.0–46.0)
Hemoglobin: 12.9 g/dL (ref 12.0–15.0)
MCH: 31.3 pg (ref 26.0–34.0)
MCHC: 34.1 g/dL (ref 30.0–36.0)
MCV: 91.7 fL (ref 78.0–100.0)
PLATELETS: 256 10*3/uL (ref 150–400)
RBC: 4.12 MIL/uL (ref 3.87–5.11)
RDW: 12.6 % (ref 11.5–15.5)
WBC: 3.4 10*3/uL — AB (ref 4.0–10.5)

## 2018-08-15 LAB — I-STAT BETA HCG BLOOD, ED (MC, WL, AP ONLY): I-stat hCG, quantitative: <5 m[IU]/mL (ref <5)

## 2018-08-15 LAB — URINALYSIS, ROUTINE W REFLEX MICROSCOPIC
Bilirubin Urine: NEGATIVE
GLUCOSE, UA: NEGATIVE mg/dL
Ketones, ur: NEGATIVE mg/dL
LEUKOCYTES UA: NEGATIVE
Nitrite: NEGATIVE
PH: 5 (ref 5.0–8.0)
Protein, ur: NEGATIVE mg/dL
SPECIFIC GRAVITY, URINE: 1.025 (ref 1.005–1.030)

## 2018-08-15 LAB — WET PREP, GENITAL
SPERM: NONE SEEN
Trich, Wet Prep: NONE SEEN

## 2018-08-15 LAB — BASIC METABOLIC PANEL
Anion gap: 10 (ref 5–15)
BUN: 11 mg/dL (ref 6–20)
CHLORIDE: 103 mmol/L (ref 98–111)
CO2: 26 mmol/L (ref 22–32)
CREATININE: 0.9 mg/dL (ref 0.44–1.00)
Calcium: 9.5 mg/dL (ref 8.9–10.3)
GFR calc non Af Amer: >60 mL/min (ref >60)
Glucose, Bld: 82 mg/dL (ref 70–99)
Potassium: 3.8 mmol/L (ref 3.5–5.1)
SODIUM: 139 mmol/L (ref 135–145)

## 2018-08-15 LAB — TSH: TSH: 1.26 u[IU]/mL (ref 0.350–4.500)

## 2018-08-15 MED ORDER — NAPROXEN 375 MG PO TABS: 375.0000 mg | ORAL_TABLET | Freq: Two times a day (BID) | ORAL | 0 refills | Status: DC

## 2018-08-15 MED ORDER — MEGESTROL ACETATE 40 MG PO TABS: 40.0000 mg | ORAL_TABLET | Freq: Every day | ORAL | 0 refills | Status: DC

## 2018-08-15 MED ORDER — CYCLOBENZAPRINE HCL 5 MG PO TABS: 5.0000 mg | ORAL_TABLET | Freq: Three times a day (TID) | ORAL | 0 refills | Status: DC | PRN

## 2018-08-15 MED ORDER — FLEET ENEMA 7-19 GM/118ML RE ENEM
1.0000 | ENEMA | Freq: Once | RECTAL | Status: AC
  Administered 2018-08-15: 1 via RECTAL
  Filled 2018-08-15: qty 1

## 2018-08-15 NOTE — ED Provider Notes (Signed)
[facility] DEPT Provider Note   CSN: [id number] Arrival date & time: 08/15/18  1502     History   Chief Complaint Chief Complaint  Patient presents with  . Vaginal Bleeding  . Fatigue    HPI Toni Chang is a 47 y.o. female G4 P4, not pregnant who presents to the ED with vaginal bleeding and feeling tired. The bleeding started 06/17/18 and has been off and on since then. Patient states that for the past week the bleeding has been heavy and since then she has started feeling tired. Patient reports low back and flank pain.   HPI  Past Medical History:  Diagnosis Date  . No pertinent past medical history     Patient Active Problem List   Diagnosis Date Noted  . BACK PAIN 05/28/2010  . DIZZINESS 02/01/2010  . ENDOMETRIOSIS OF UTERUS 02/01/2010  . OTHER SPECIFIED DISORDER OF SKIN 12/20/2009  . VITAMIN D DEFICIENCY 05/24/2009  . TENDINITIS 05/24/2009  . LEUKOCYTOSIS 02/28/2009  . DIVERTICULITIS OF COLON 12/12/2008  . IRREGULAR MENSES 03/24/2008  . FATIGUE 03/24/2008  . NAUSEA 03/24/2008  . HYPERCHOLESTEROLEMIA 01/12/2008  . ALLERGIC REACTION 12/15/2007  . OTHER ADVERSE FOOD REACTIONS NEC 12/15/2007    Past Surgical History:  Procedure Laterality Date  . DILATION AND CURETTAGE OF UTERUS  01/2011  . SHOULDER SURGERY  06/2011   right rotator cuff     OB History    Gravida  4   Para  4   Term  4   Preterm  0   AB  0   Living  4     SAB  0   TAB  0   Ectopic  0   Multiple  0   Live Births               Home Medications    Prior to Admission medications   Medication Sig Start Date End Date Taking? Authorizing Provider  cyclobenzaprine (FLEXERIL) 5 MG tablet Take 1 tablet (5 mg total) by mouth 3 (three) times daily as needed for muscle spasms. 08/15/18   Ashley Murrain, NP  megestrol (MEGACE) 40 MG tablet Take 1 tablet (40 mg total) by mouth daily. 08/15/18   Ashley Murrain, NP  naproxen (NAPROSYN) 375 MG tablet  Take 1 tablet (375 mg total) by mouth 2 (two) times daily. 08/15/18   Ashley Murrain, NP  oxyCODONE-acetaminophen (PERCOCET/ROXICET) 5-325 MG tablet Take 1-2 tablets by mouth every 8 (eight) hours as needed for severe pain. 06/07/18   Davonna Belling, MD    Family History Family History  Problem Relation Age of Onset  . Diabetes Mother   . Diabetes Father   . Anesthesia problems Neg Hx     Social History Social History   Tobacco Use  . Smoking status: Never Smoker  . Smokeless tobacco: Never Used  Substance Use Topics  . Alcohol use: No  . Drug use: No     Allergies   Eggs or egg-derived products; Onion; and Tomato   Review of Systems Review of Systems  Constitutional: Positive for fatigue.  Genitourinary: Positive for pelvic pain and vaginal bleeding.  All other systems reviewed and are negative.    Physical Exam Updated Vital Signs BP 111/75 (BP Location: Right Arm)   Pulse 64   Temp 98.1 F (36.7 C) (Oral)   Resp 18   SpO2 99%   Physical Exam  Constitutional: She appears well-developed and well-nourished. No distress.  HENT:  Head: Normocephalic.  Eyes: EOM are normal.  Neck: Neck supple.  Cardiovascular: Normal rate.  Pulmonary/Chest: Effort normal.  Abdominal: Soft. There is tenderness.  Lower abdominal tenderness with palpation, no guarding or rebound.  Genitourinary: Rectal exam shows no external hemorrhoid, no internal hemorrhoid, no tenderness and anal tone normal.  Genitourinary Comments: External genitalia without lesions, moderate blood vaginal vault, mild CMT, mild adnexal tenderness. Uterus without palpable enlargement.  Large amount of hard stool palpated on rectal exam.  Musculoskeletal: Normal range of motion.  Neurological: She is alert.  Skin: Skin is warm and dry.  Psychiatric: She has a normal mood and affect.  Nursing note and vitals reviewed.    ED Treatments / Results  Labs (all labs ordered are listed, but only abnormal  results are displayed) Labs Reviewed  WET PREP, GENITAL - Abnormal; Notable for the following components:      Result Value   Yeast Wet Prep HPF POC PRESENT (*)    Clue Cells Wet Prep HPF POC PRESENT (*)    WBC, Wet Prep HPF POC FEW (*)    All other components within normal limits  CBC - Abnormal; Notable for the following components:   WBC 3.4 (*)    All other components within normal limits  URINALYSIS, ROUTINE W REFLEX MICROSCOPIC - Abnormal; Notable for the following components:   Hgb urine dipstick LARGE (*)    Bacteria, UA RARE (*)    All other components within normal limits  BASIC METABOLIC PANEL  TSH  T4, FREE  I-STAT BETA HCG BLOOD, ED ([facility], [facility], [facility] ONLY)  GC/CHLAMYDIA PROBE AMP ([place]) NOT AT [facility]   Radiology No results found.  Procedures Procedures (including critical care time)  Medications Ordered in ED Medications  sodium phosphate (FLEET) 7-19 GM/118ML enema 1 enema (1 enema Rectal Given 08/15/18 1750)     Initial Impression / Assessment and Plan / ED Course  I have reviewed the triage vital signs and the nursing notes. 47 y.o. female who presents to the ED with abnormal vaginal bleeding stable for d/c without hemorrhage and normal Hgb. Will treat with Megace and patient will f/u with the [facility]. Patient had good results with Fleet enema and reports having large stool. Discussed return precautions with the patient. She voices understanding and agrees with plan.   Final Clinical Impressions(s) / ED Diagnoses   Final diagnoses:  Abnormal vaginal bleeding  Other fatigue    ED Discharge Orders         Ordered    megestrol (MEGACE) 40 MG tablet  Daily     08/15/18 1911    cyclobenzaprine (FLEXERIL) 5 MG tablet  3 times daily PRN     08/15/18 1911    naproxen (NAPROSYN) 375 MG tablet  2 times daily     08/15/18 1911           Debroah Baller Metter, [place] 08/15/18 1919    Virgel Manifold, MD 08/19/18 1444

## 2018-08-15 NOTE — ED Triage Notes (Signed)
Patient c/o intermittent vaginal bleeding since 6/27. Reports heavy bleeding x1 week with increased fatigue. Also c/o lower back pain and bilateral flank pain.

## 2018-08-15 NOTE — Discharge Instructions (Addendum)
[facility] to schedule a follow up appointment. Tell them you were seen in the ED with abnormal bleeding and we started medication and you need follow up for possible endometrial biopsy.

## 2018-08-16 LAB — T4, FREE: FREE T4: 0.82 ng/dL (ref 0.82–1.77)

## 2018-08-16 LAB — GC/CHLAMYDIA PROBE AMP (~~LOC~~) NOT AT ARMC
CHLAMYDIA, DNA PROBE: NEGATIVE
Neisseria Gonorrhea: NEGATIVE

## 2018-08-26 ENCOUNTER — Other Ambulatory Visit (HOSPITAL_COMMUNITY)
Admission: RE | Admit: 2018-08-26 | Discharge: 2018-08-26 | Disposition: A | Discharge status: Home / Self Care | Payer: Medicaid Other | Source: Ambulatory Visit | Attending: Obstetrics and Gynecology | Admitting: Obstetrics and Gynecology

## 2018-08-26 ENCOUNTER — Ambulatory Visit (INDEPENDENT_AMBULATORY_CARE_PROVIDER_SITE_OTHER): Payer: Medicaid Other | Admitting: Obstetrics and Gynecology

## 2018-08-26 ENCOUNTER — Encounter: Payer: Self-pay | Admitting: Obstetrics and Gynecology

## 2018-08-26 ENCOUNTER — Other Ambulatory Visit: Payer: Self-pay

## 2018-08-26 VITALS — BP 99/74 | HR 74 | Ht 63.0 in | Wt 185.2 lb

## 2018-08-26 DIAGNOSIS — Z01411 Encounter for gynecological examination (general) (routine) with abnormal findings: Secondary | ICD-10-CM

## 2018-08-26 DIAGNOSIS — N926 Irregular menstruation, unspecified: Secondary | ICD-10-CM

## 2018-08-26 DIAGNOSIS — Z0000 Encounter for general adult medical examination without abnormal findings: Secondary | ICD-10-CM

## 2018-08-26 DIAGNOSIS — Z01419 Encounter for gynecological examination (general) (routine) without abnormal findings: Secondary | ICD-10-CM | POA: Insufficient documentation

## 2018-08-26 MED ORDER — MEGESTROL ACETATE 20 MG PO TABS
20.0000 mg | ORAL_TABLET | Freq: Every day | ORAL | 2 refills | Status: DC
Start: 2018-08-26 — End: 2018-09-29

## 2018-08-26 NOTE — Progress Notes (Signed)
Patient ID: Toni Chang, female   DOB: 05/28/1971, 47 y.o.   MRN: [id number]  Toni Chang is a 47 y.o. [phone] female here for a routine annual gynecologic exam. She reports irregular cycles since July. Seen in ER had normal TSH and CBC. Pt reports normal cycles prior. Was started on Megace and bleeding stopped but ran out of Megace this past Tuesday. Denies menopausal SX H/O uterine fibroids. TSVD x 4 largest 7#. Last pap and mammogram in 2016. She denies any chronic medical problems or meidcations   Gynecologic History Patient's last menstrual period was 07/17/2018 (exact date). Contraception: tubal ligation Last Pap: 2016. Results were: normal Last mammogram: 2016. Results were: normal  Obstetric History OB History  Gravida Para Term Preterm AB Living  4 4 4  0 0 4  SAB TAB Ectopic Multiple Live Births  0 0 0 0      # Outcome Date GA Lbr Len/2nd Weight Sex Delivery Anes PTL Lv  4 Term           3 Term           2 Term           1 Term             Past Medical History:  Diagnosis Date  . No pertinent past medical history     Past Surgical History:  Procedure Laterality Date  . DILATION AND CURETTAGE OF UTERUS  01/2011  . SHOULDER SURGERY  06/2011   right rotator cuff  . TUBAL LIGATION      Current Outpatient Medications on File Prior to Visit  Medication Sig Dispense Refill  . cyclobenzaprine (FLEXERIL) 5 MG tablet Take 1 tablet (5 mg total) by mouth 3 (three) times daily as needed for muscle spasms. 30 tablet 0  . naproxen (NAPROSYN) 375 MG tablet Take 1 tablet (375 mg total) by mouth 2 (two) times daily. 20 tablet 0  . oxyCODONE-acetaminophen (PERCOCET/ROXICET) 5-325 MG tablet Take 1-2 tablets by mouth every 8 (eight) hours as needed for severe pain. 20 tablet 0   No current facility-administered medications on file prior to visit.     Allergies  Allergen Reactions  . Eggs Or Egg-Derived Products Hives  . Onion Hives  . Tomato Hives    Social History    Socioeconomic History  . Marital status: Married    Spouse name: Not on file  . Number of children: Not on file  . Years of education: Not on file  . Highest education level: Not on file  Occupational History  . Not on file  Social Needs  . Financial resource strain: Not on file  . Food insecurity:    Worry: Not on file    Inability: Not on file  . Transportation needs:    Medical: Not on file    Non-medical: Not on file  Tobacco Use  . Smoking status: Never Smoker  . Smokeless tobacco: Never Used  Substance and Sexual Activity  . Alcohol use: No  . Drug use: No  . Sexual activity: Yes    Birth control/protection: None  Lifestyle  . Physical activity:    Days per week: Not on file    Minutes per session: Not on file  . Stress: Not on file  Relationships  . Social connections:    Talks on phone: Not on file    Gets together: Not on file    Attends religious service: Not on file    Active  member of club or organization: Not on file    Attends meetings of clubs or organizations: Not on file    Relationship status: Not on file  . Intimate partner violence:    Fear of current or ex partner: Not on file    Emotionally abused: Not on file    Physically abused: Not on file    Forced sexual activity: Not on file  Other Topics Concern  . Not on file  Social History Narrative  . Not on file    Family History  Problem Relation Age of Onset  . Diabetes Mother   . Diabetes Father   . Anesthesia problems Neg Hx     The following portions of the patient's history were reviewed and updated as appropriate: allergies, current medications, past family history, past medical history, past social history, past surgical history and problem list.  Review of Systems Pertinent items noted in HPI and remainder of comprehensive ROS otherwise negative.   Objective:  BP 99/74   Pulse 74   Ht 5\' 3"  (1.6 m)   Wt 185 lb 3.2 oz (84 kg)   LMP 07/17/2018 (Exact Date) Comment: x 3+  weeks  BMI 32.81 kg/m  CONSTITUTIONAL: Well-developed, well-nourished female in no acute distress.  HENT:  Normocephalic, atraumatic, External right and left ear normal. Oropharynx is clear and moist EYES: Conjunctivae and EOM are normal. Pupils are equal, round, and reactive to light. No scleral icterus.  NECK: Normal range of motion, supple, no masses.  Normal thyroid.  SKIN: Skin is warm and dry. No rash noted. Not diaphoretic. No erythema. No pallor. [place]: Alert and oriented to person, place, and time. Normal reflexes, muscle tone coordination. No cranial nerve deficit noted. PSYCHIATRIC: Normal mood and affect. Normal behavior. Normal judgment and thought content. CARDIOVASCULAR: Normal heart rate noted, regular rhythm RESPIRATORY: Clear to auscultation bilaterally. Effort and breath sounds normal, no problems with respiration noted. BREASTS: Symmetric in size. No masses, skin changes, nipple drainage, or lymphadenopathy. ABDOMEN: Soft, normal bowel sounds, no distention noted.  No tenderness, rebound or guarding.  PELVIC: Normal appearing external genitalia; normal appearing vaginal mucosa and cervix.  No abnormal discharge noted.  Pap smear obtained.  10 week uterine size, no other palpable masses, no uterine or adnexal tenderness. MUSCULOSKELETAL: Normal range of motion. No tenderness.  No cyanosis, clubbing, or edema.  2+ distal pulses.   Assessment:  Annual gynecologic examination with pap smear DUB Plan:  Will follow up results of pap smear and manage accordingly. Mammogram scheduled Routine preventative health maintenance measures emphasized. DUB reviewed with pt. Will check GYN U/S. Will restart Megace at 20 mg qd. F/U in 4 weeks with U/S results and EMBX Please refer to After Visit Summary for other counseling recommendations.    Chancy Milroy, MD, [place] Attending [place] for [facility] [place]

## 2018-08-26 NOTE — Progress Notes (Signed)
New GYN presents to establish care.  C/o having a period every other week for 3+ weeks, LMP 7/27-31; 8/5-9; 8/17-30, lower abdominal and back pain 5/10 x 3+ weeks.  Denies discharge, NV, fever, chills.

## 2018-08-26 NOTE — Patient Instructions (Signed)
Dysfunctional Uterine Bleeding Dysfunctional uterine bleeding is abnormal bleeding from the uterus. Dysfunctional uterine bleeding includes:  A period that comes earlier or later than usual.  A period that is lighter, heavier, or has blood clots.  Bleeding between periods.  Skipping one or more periods.  Bleeding after sexual intercourse.  Bleeding after menopause.  Follow these instructions at home: Pay attention to any changes in your symptoms. Follow these instructions to help with your condition: Eating and drinking  Eat well-balanced meals. Include foods that are high in iron, such as liver, meat, shellfish, green leafy vegetables, and eggs.  If you become constipated: ? Drink plenty of water. ? Eat fruits and vegetables that are high in water and fiber, such as spinach, carrots, raspberries, apples, and mango. Medicines  Take over-the-counter and prescription medicines only as told by your health care provider.  Do not change medicines without talking with your health care provider.  Aspirin or medicines that contain aspirin may make the bleeding worse. Do not take those medicines: ? During the week before your period. ? During your period.  If you were prescribed iron pills, take them as told by your health care provider. Iron pills help to replace iron that your body loses because of this condition. Activity  If you need to change your sanitary pad or tampon more than one time every 2 hours: ? Lie in bed with your feet raised (elevated). ? Place a cold pack on your lower abdomen. ? Rest as much as possible until the bleeding stops or slows down.  Do not try to lose weight until the bleeding has stopped and your blood iron level is back to normal. Other Instructions  For two months, write down: ? When your period starts. ? When your period ends. ? When any abnormal bleeding occurs. ? What problems you notice.  Keep all follow up visits as told by your health  care provider. This is important. Contact a health care provider if:  You get light-headed or weak.  You have nausea and vomiting.  You cannot eat or drink without vomiting.  You feel dizzy or have diarrhea while you are taking medicines.  You are taking birth control pills or hormones, and you want to change them or stop taking them. Get help right away if:  You develop a fever or chills.  You need to change your sanitary pad or tampon more than one time per hour.  Your bleeding becomes heavier, or your flow contains clots more often.  You develop pain in your abdomen.  You lose consciousness.  You develop a rash. This information is not intended to replace advice given to you by your health care provider. Make sure you discuss any questions you have with your health care provider. Document Released: 12/05/2000 Document Revised: 05/15/2016 Document Reviewed: 03/05/2015 Elsevier Interactive Patient Education  2018 [place] Maintenance, Female Adopting a healthy lifestyle and getting preventive care can go a long way to promote health and wellness. Talk with your health care provider about what schedule of regular examinations is right for you. This is a good chance for you to check in with your provider about disease prevention and staying healthy. In between checkups, there are plenty of things you can do on your own. Experts have done a lot of research about which lifestyle changes and preventive measures are most likely to keep you healthy. Ask your health care provider for more information. Weight and diet Eat a healthy diet  Be sure to include plenty of vegetables, fruits, low-fat dairy products, and lean protein.  Do not eat a lot of foods high in solid fats, added sugars, or salt.  Get regular exercise. This is one of the most important things you can do for your health. ? Most adults should exercise for at least 150 minutes each week. The exercise should  increase your heart rate and make you sweat (moderate-intensity exercise). ? Most adults should also do strengthening exercises at least twice a week. This is in addition to the moderate-intensity exercise.  Maintain a healthy weight  Body mass index (BMI) is a measurement that can be used to identify possible weight problems. It estimates body fat based on height and weight. Your health care provider can help determine your BMI and help you achieve or maintain a healthy weight.  For females 51 years of age and older: ? A BMI below 18.5 is considered underweight. ? A BMI of 18.5 to 24.9 is normal. ? A BMI of 25 to 29.9 is considered overweight. ? A BMI of 30 and above is considered obese.  Watch levels of cholesterol and blood lipids  You should start having your blood tested for lipids and cholesterol at 48 years of age, then have this test every 5 years.  You may need to have your cholesterol levels checked more often if: ? Your lipid or cholesterol levels are high. ? You are older than 48 years of age. ? You are at high risk for heart disease.  Cancer screening Lung Cancer  Lung cancer screening is recommended for adults 4-37 years old who are at high risk for lung cancer because of a history of smoking.  A yearly low-dose CT scan of the lungs is recommended for people who: ? Currently smoke. ? Have quit within the past 15 years. ? Have at least a 30-pack-year history of smoking. A pack year is smoking an average of one pack of cigarettes a day for 1 year.  Yearly screening should continue until it has been 15 years since you quit.  Yearly screening should stop if you develop a health problem that would prevent you from having lung cancer treatment.  Breast Cancer  Practice breast self-awareness. This means understanding how your breasts normally appear and feel.  It also means doing regular breast self-exams. Let your health care provider know about any changes, no matter  how small.  If you are in your 20s or 30s, you should have a clinical breast exam (CBE) by a health care provider every 1-3 years as part of a regular health exam.  If you are 18 or older, have a CBE every year. Also consider having a breast X-ray (mammogram) every year.  If you have a family history of breast cancer, talk to your health care provider about genetic screening.  If you are at high risk for breast cancer, talk to your health care provider about having an MRI and a mammogram every year.  Breast cancer gene (BRCA) assessment is recommended for women who have family members with BRCA-related cancers. BRCA-related cancers include: ? Breast. ? Ovarian. ? Tubal. ? Peritoneal cancers.  Results of the assessment will determine the need for genetic counseling and BRCA1 and BRCA2 testing.  Cervical Cancer Your health care provider may recommend that you be screened regularly for cancer of the pelvic organs (ovaries, uterus, and vagina). This screening involves a pelvic examination, including checking for microscopic changes to the surface of your cervix (Pap  test). You may be encouraged to have this screening done every 3 years, beginning at age 38.  For women ages 13-65, health care providers may recommend pelvic exams and Pap testing every 3 years, or they may recommend the Pap and pelvic exam, combined with testing for human papilloma virus (HPV), every 5 years. Some types of HPV increase your risk of cervical cancer. Testing for HPV may also be done on women of any age with unclear Pap test results.  Other health care providers may not recommend any screening for nonpregnant women who are considered low risk for pelvic cancer and who do not have symptoms. Ask your health care provider if a screening pelvic exam is right for you.  If you have had past treatment for cervical cancer or a condition that could lead to cancer, you need Pap tests and screening for cancer for at least 20  years after your treatment. If Pap tests have been discontinued, your risk factors (such as having a new sexual partner) need to be reassessed to determine if screening should resume. Some women have medical problems that increase the chance of getting cervical cancer. In these cases, your health care provider may recommend more frequent screening and Pap tests.  Colorectal Cancer  This type of cancer can be detected and often prevented.  Routine colorectal cancer screening usually begins at 48 years of age and continues through 48 years of age.  Your health care provider may recommend screening at an earlier age if you have risk factors for colon cancer.  Your health care provider may also recommend using home test kits to check for hidden blood in the stool.  A small camera at the end of a tube can be used to examine your colon directly (sigmoidoscopy or colonoscopy). This is done to check for the earliest forms of colorectal cancer.  Routine screening usually begins at age 39.  Direct examination of the colon should be repeated every 5-10 years through 48 years of age. However, you may need to be screened more often if early forms of precancerous polyps or small growths are found.  Skin Cancer  Check your skin from head to toe regularly.  Tell your health care provider about any new moles or changes in moles, especially if there is a change in a mole's shape or color.  Also tell your health care provider if you have a mole that is larger than the size of a pencil eraser.  Always use sunscreen. Apply sunscreen liberally and repeatedly throughout the day.  Protect yourself by wearing long sleeves, pants, a wide-brimmed hat, and sunglasses whenever you are outside.  Heart disease, diabetes, and high blood pressure  High blood pressure causes heart disease and increases the risk of stroke. High blood pressure is more likely to develop in: ? People who have blood pressure in the high  end of the normal range (130-139/85-89 mm Hg). ? People who are overweight or obese. ? People who are African American.  If you are 73-15 years of age, have your blood pressure checked every 3-5 years. If you are 2 years of age or older, have your blood pressure checked every year. You should have your blood pressure measured twice-once when you are at a hospital or clinic, and once when you are not at a hospital or clinic. Record the average of the two measurements. To check your blood pressure when you are not at a hospital or clinic, you can use: ? An automated blood  pressure machine at a pharmacy. ? A home blood pressure monitor.  If you are between 31 years and 79 years old, ask your health care provider if you should take aspirin to prevent strokes.  Have regular diabetes screenings. This involves taking a blood sample to check your fasting blood sugar level. ? If you are at a normal weight and have a low risk for diabetes, have this test once every three years after 48 years of age. ? If you are overweight and have a high risk for diabetes, consider being tested at a younger age or more often. Preventing infection Hepatitis B  If you have a higher risk for hepatitis B, you should be screened for this virus. You are considered at high risk for hepatitis B if: ? You were born in a country where hepatitis B is common. Ask your health care provider which countries are considered high risk. ? Your parents were born in a high-risk country, and you have not been immunized against hepatitis B (hepatitis B vaccine). ? You have HIV or AIDS. ? You use needles to inject street drugs. ? You live with someone who has hepatitis B. ? You have had sex with someone who has hepatitis B. ? You get hemodialysis treatment. ? You take certain medicines for conditions, including cancer, organ transplantation, and autoimmune conditions.  Hepatitis C  Blood testing is recommended for: ? Everyone born from  [birth year] through 1966. ? Anyone with known risk factors for hepatitis C.  Sexually transmitted infections (STIs)  You should be screened for sexually transmitted infections (STIs) including gonorrhea and chlamydia if: ? You are sexually active and are younger than 48 years of age. ? You are older than 48 years of age and your health care provider tells you that you are at risk for this type of infection. ? Your sexual activity has changed since you were last screened and you are at an increased risk for chlamydia or gonorrhea. Ask your health care provider if you are at risk.  If you do not have HIV, but are at risk, it may be recommended that you take a prescription medicine daily to prevent HIV infection. This is called pre-exposure prophylaxis (PrEP). You are considered at risk if: ? You are sexually active and do not regularly use condoms or know the HIV status of your partner(s). ? You take drugs by injection. ? You are sexually active with a partner who has HIV.  Talk with your health care provider about whether you are at high risk of being infected with HIV. If you choose to begin PrEP, you should first be tested for HIV. You should then be tested every 3 months for as long as you are taking PrEP. Pregnancy  If you are premenopausal and you may become pregnant, ask your health care provider about preconception counseling.  If you may become pregnant, take 400 to 800 micrograms (mcg) of folic acid every day.  If you want to prevent pregnancy, talk to your health care provider about birth control (contraception). Osteoporosis and menopause  Osteoporosis is a disease in which the bones lose minerals and strength with aging. This can result in serious bone fractures. Your risk for osteoporosis can be identified using a bone density scan.  If you are 68 years of age or older, or if you are at risk for osteoporosis and fractures, ask your health care provider if you should be  screened.  Ask your health care provider whether you should take  a calcium or vitamin D supplement to lower your risk for osteoporosis.  Menopause may have certain physical symptoms and risks.  Hormone replacement therapy may reduce some of these symptoms and risks. Talk to your health care provider about whether hormone replacement therapy is right for you. Follow these instructions at home:  Schedule regular health, dental, and eye exams.  Stay current with your immunizations.  Do not use any tobacco products including cigarettes, chewing tobacco, or electronic cigarettes.  If you are pregnant, do not drink alcohol.  If you are breastfeeding, limit how much and how often you drink alcohol.  Limit alcohol intake to no more than 1 drink per day for nonpregnant women. One drink equals 12 ounces of beer, 5 ounces of wine, or 1 ounces of hard liquor.  Do not use street drugs.  Do not share needles.  Ask your health care provider for help if you need support or information about quitting drugs.  Tell your health care provider if you often feel depressed.  Tell your health care provider if you have ever been abused or do not feel safe at home. This information is not intended to replace advice given to you by your health care provider. Make sure you discuss any questions you have with your health care provider. Document Released: 06/23/2011 Document Revised: 05/15/2016 Document Reviewed: 09/11/2015 Elsevier Interactive Patient Education  [employer].

## 2018-08-27 LAB — CYTOLOGY - PAP
Diagnosis: NEGATIVE
HPV: NOT DETECTED

## 2018-08-31 ENCOUNTER — Telehealth (HOSPITAL_COMMUNITY): Payer: Self-pay | Admitting: *Deleted

## 2018-08-31 NOTE — Telephone Encounter (Signed)
Patient referred to [facility] for a screening mammogram. Called patient and scheduled patient appointment with [facility] on Tuesday, November 09, 2018 at 1330. Patient recently had a Pap smear completed and stated she has not received the results. Let patient know that her Pap smear was normal and HPV negative. Patient verbalized understanding.

## 2018-09-02 ENCOUNTER — Other Ambulatory Visit: Payer: Self-pay | Admitting: Obstetrics and Gynecology

## 2018-09-02 DIAGNOSIS — Z1231 Encounter for screening mammogram for malignant neoplasm of breast: Secondary | ICD-10-CM

## 2018-09-29 ENCOUNTER — Ambulatory Visit (INDEPENDENT_AMBULATORY_CARE_PROVIDER_SITE_OTHER): Payer: Self-pay | Admitting: Obstetrics and Gynecology

## 2018-09-29 ENCOUNTER — Encounter: Payer: Self-pay | Admitting: Obstetrics and Gynecology

## 2018-09-29 VITALS — BP 112/68 | HR 88 | Wt 185.0 lb

## 2018-09-29 DIAGNOSIS — N926 Irregular menstruation, unspecified: Secondary | ICD-10-CM

## 2018-09-29 MED ORDER — MEGESTROL ACETATE 40 MG PO TABS: 40.0000 mg | ORAL_TABLET | Freq: Every day | ORAL | 5 refills | Status: DC

## 2018-09-29 NOTE — Progress Notes (Signed)
Pt scheduled for u/s 10/05/18. EBx scheduled 10/26/18. Pt states Megace is not working and requests something different.

## 2018-09-29 NOTE — Progress Notes (Signed)
Patient ID: Toni Chang, female   DOB: June 04, 1971, 47 y.o.   MRN: [id number] [name] is here for f/u visit. Has not had U/S yet.  Pt reports vaginal bleeding returned about 1 week after starting Megace 20 mg qd. Some degree of vaginal bleeding daily since 09/08/18. Changes pads q 3-4 hours. Stopped Megace 2 days ago.  PE AF  VSS Lungs clear Heart RRR Abd soft + BS  A/P Irregular menses  U/S and EMBX appt scheduled. Will increase Megace to 40 mg qd. Pt has responded to this Tx in the past. Pt instructed to call office if no change in her bleeding pattern within a week. O/W will f/u after U/S for EMBX.

## 2018-09-29 NOTE — Patient Instructions (Signed)
Endometrial Biopsy  Endometrial biopsy is a procedure in which a tissue sample is taken from inside the uterus. The sample is taken from the endometrium, which is the lining of the uterus. The tissue sample is then checked under a microscope to see if the tissue is normal or abnormal. This procedure helps to determine where you are in your menstrual cycle and how hormone levels are affecting the lining of the uterus. This procedure may also be used to evaluate uterine bleeding or to diagnose endometrial cancer, endometrial tuberculosis, polyps, or other inflammatory conditions.  Tell a health care provider about:   Any allergies you have.   All medicines you are taking, including vitamins, herbs, eye drops, creams, and over-the-counter medicines.   Any problems you or family members have had with anesthetic medicines.   Any blood disorders you have.   Any surgeries you have had.   Any medical conditions you have.   Whether you are pregnant or may be pregnant.  What are the risks?  Generally, this is a safe procedure. However, problems may occur, including:   Bleeding.   Pelvic infection.   Puncture of the wall of the uterus with the biopsy device (rare).    What happens before the procedure?   Keep a record of your menstrual cycles as told by your health care provider. You may need to schedule your procedure for a specific time in your cycle.   You may want to bring a sanitary pad to wear after the procedure.   Ask your health care provider about:  ? Changing or stopping your regular medicines. This is especially important if you are taking diabetes medicines or blood thinners.  ? Taking medicines such as aspirin and ibuprofen. These medicines can thin your blood. Do not take these medicines before your procedure if your health care provider instructs you not to.   Plan to have someone take you home from the hospital or clinic.  What happens during the procedure?   To lower your risk of  infection:  ? Your health care team will wash or sanitize their hands.   You will lie on an exam table with your feet and legs supported as in a pelvic exam.   Your health care provider will insert an instrument (speculum) into your vagina to see your cervix.   Your cervix will be cleansed with an antiseptic solution.   A medicine (local anesthetic) will be used to numb the cervix.   A forceps instrument (tenaculum) will be used to hold your cervix steady for the biopsy.   A thin, rod-like instrument (uterine sound) will be inserted through your cervix to determine the length of your uterus and the location where the biopsy sample will be removed.   A thin, flexible tube (catheter) will be inserted through your cervix and into the uterus. The catheter will be used to collect the biopsy sample from your endometrial tissue.   The catheter and speculum will then be removed, and the tissue sample will be sent to a lab for examination.  What happens after the procedure?   You will rest in a recovery area until you are ready to go home.   You may have mild cramping and a small amount of vaginal bleeding. This is normal.   It is up to you to get the results of your procedure. Ask your health care provider, or the department that is doing the procedure, when your results will be ready.  Summary     Endometrial biopsy is a procedure in which a tissue sample is taken from the endometrium, which is the lining of the uterus.   This procedure may help to diagnose menstrual cycle problems, abnormal bleeding, or other conditions affecting the endometrium.   Before the procedure, keep a record of your menstrual cycles as told by your health care provider.   The tissue sample that is removed will be checked under a microscope to see if it is normal or abnormal.  This information is not intended to replace advice given to you by your health care provider. Make sure you discuss any questions you have with your health care  provider.  Document Released: 04/10/2005 Document Revised: 12/24/2016 Document Reviewed: 12/24/2016  Elsevier Interactive Patient Education  2017 Else[employer]

## 2018-10-05 ENCOUNTER — Telehealth: Payer: Self-pay

## 2018-10-05 ENCOUNTER — Ambulatory Visit (HOSPITAL_COMMUNITY)
Admission: RE | Admit: 2018-10-05 | Discharge: 2018-10-05 | Disposition: A | Discharge status: Home / Self Care | Payer: Self-pay | Source: Ambulatory Visit | Attending: Obstetrics and Gynecology | Admitting: Obstetrics and Gynecology

## 2018-10-05 DIAGNOSIS — D259 Leiomyoma of uterus, unspecified: Secondary | ICD-10-CM | POA: Insufficient documentation

## 2018-10-05 DIAGNOSIS — N926 Irregular menstruation, unspecified: Secondary | ICD-10-CM | POA: Insufficient documentation

## 2018-10-05 NOTE — Telephone Encounter (Signed)
TC from pt as directed from 09/29/18 office note to contact the office if no change in her bleeding pattern. Pt stats Megace 40mg  did not work Changing pad every 2hrs denies any clots, no pain F/U appt and EMBX on 10/26/18.

## 2018-10-06 ENCOUNTER — Other Ambulatory Visit: Payer: Self-pay

## 2018-10-06 DIAGNOSIS — N926 Irregular menstruation, unspecified: Secondary | ICD-10-CM

## 2018-10-06 MED ORDER — NORETHINDRONE ACETATE 5 MG PO TABS: 5.0000 mg | ORAL_TABLET | Freq: Every day | ORAL | 2 refills | Status: DC

## 2018-10-06 NOTE — Telephone Encounter (Signed)
Switch pt from Megace to Aygestin 5 mg po qd

## 2018-10-06 NOTE — Progress Notes (Signed)
Message below directed from [place] [facility] to pt to make aware of medication change.   Switch pt from Megace to Aygestin 5 mg po qd.

## 2018-10-17 IMAGING — US US PELVIS COMPLETE TRANSABD/TRANSVAG
1 series · 15 of 25 positions shown · non-contrast
Comparison: 08/16/2015

CLINICAL DATA: Irregular menstrual cycles



[Series 1: us pelvis complete transabd/transvag · 15 of 57 slices shown]
[im 1/57]
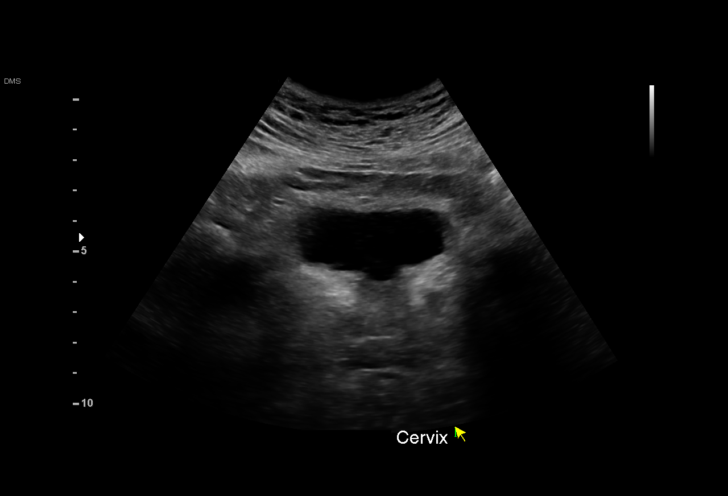
[im 5/57]
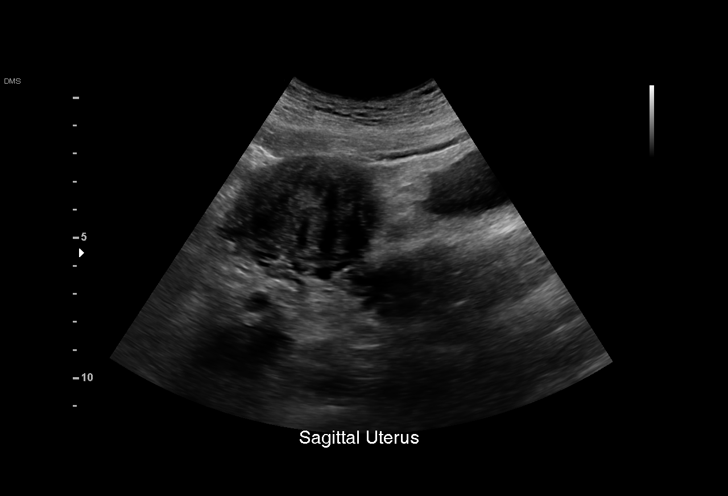
[im 10/57]
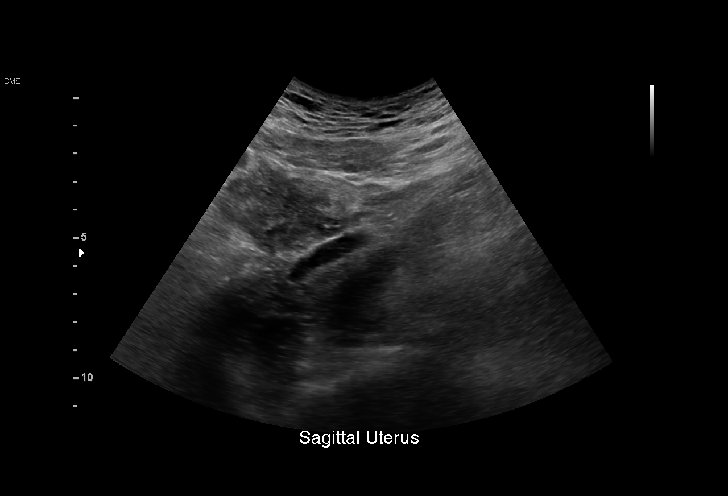
[im 12/57]
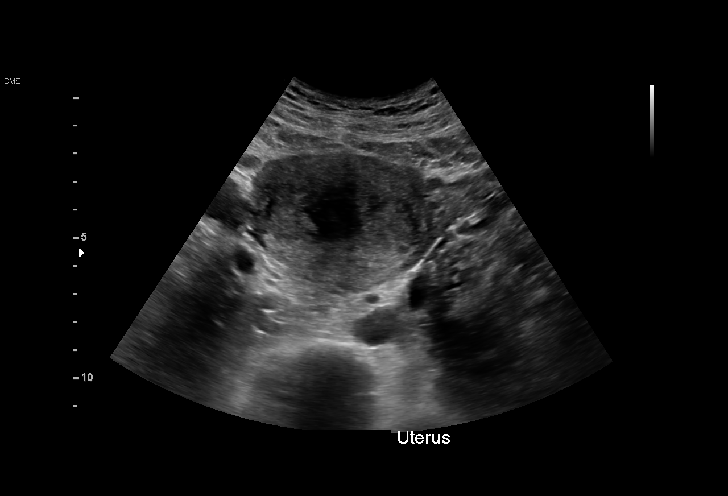
[im 17/57]
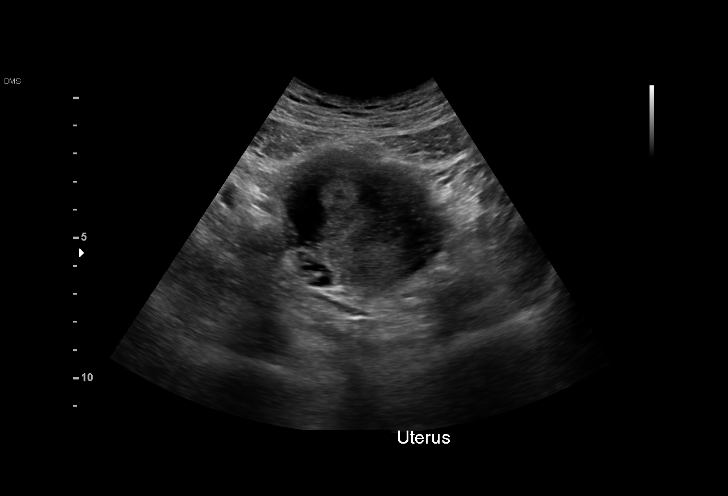
[im 22/57]
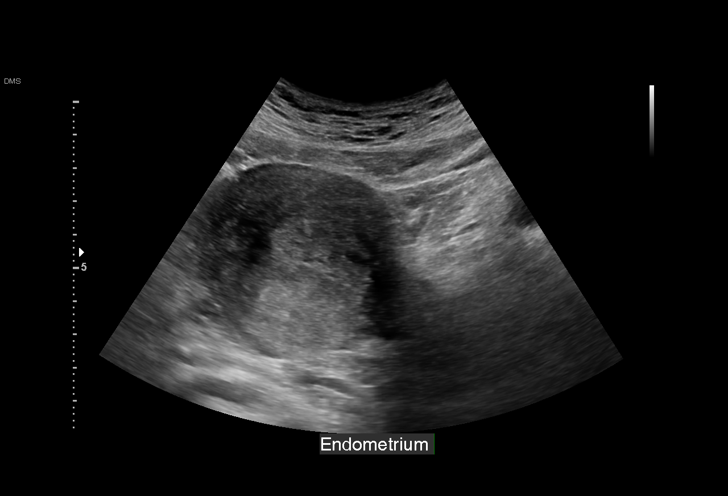
[im 24/57]
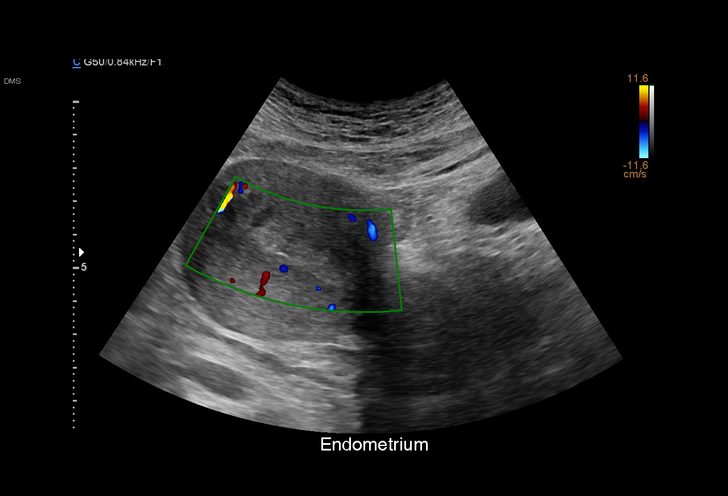
[im 29/57]
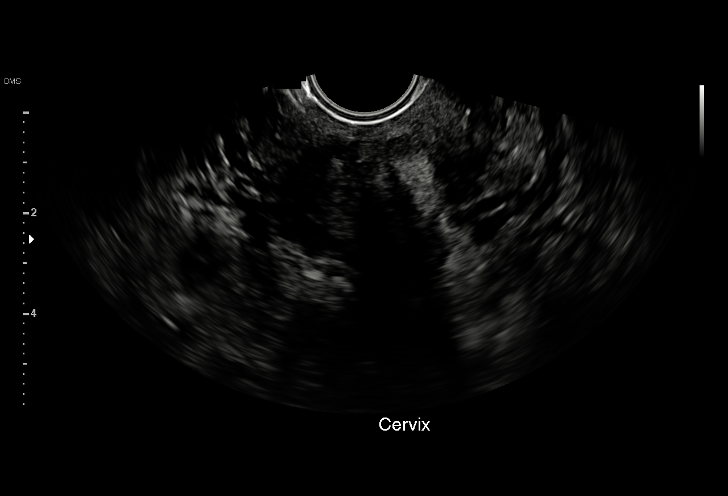
[im 33/57]
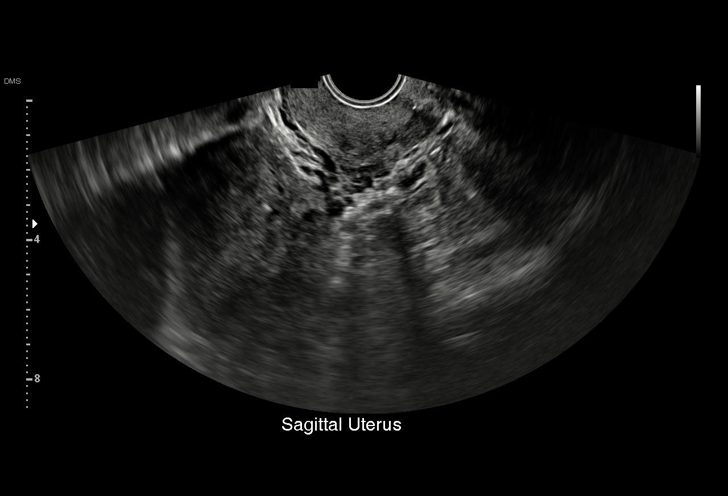
[im 36/57]
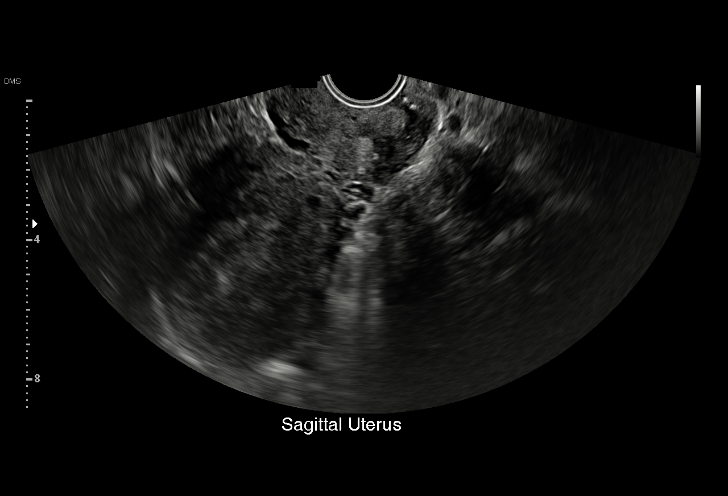
[im 40/57]
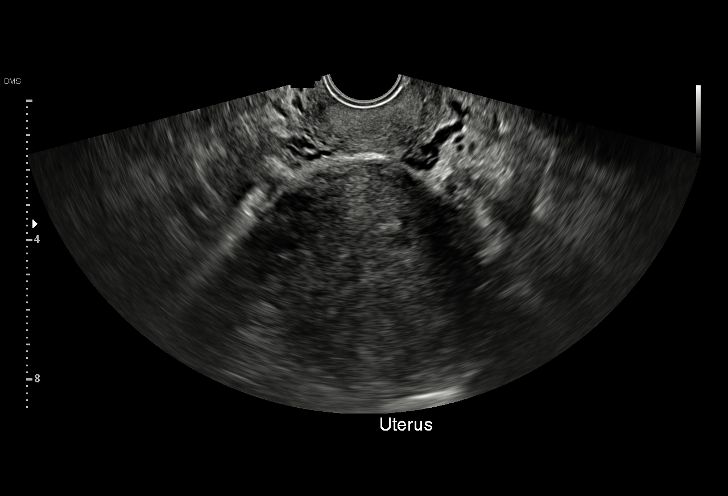
[im 45/57]
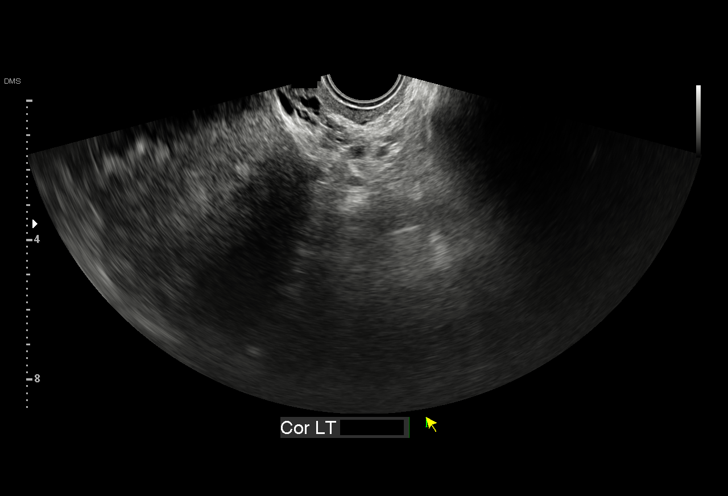
[im 47/57]
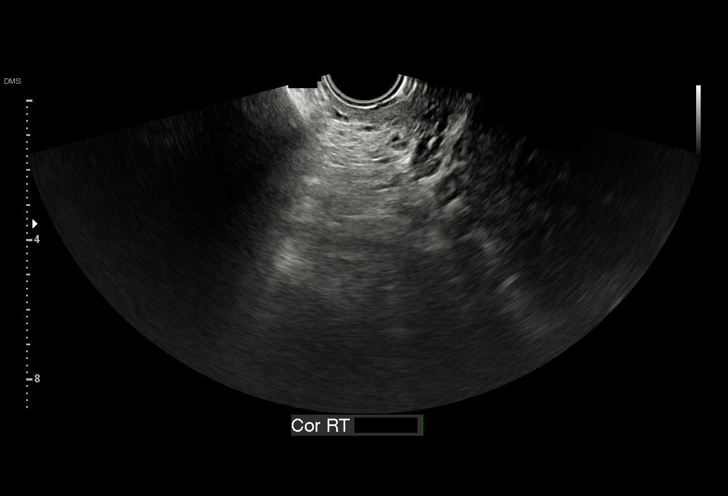
[im 52/57]
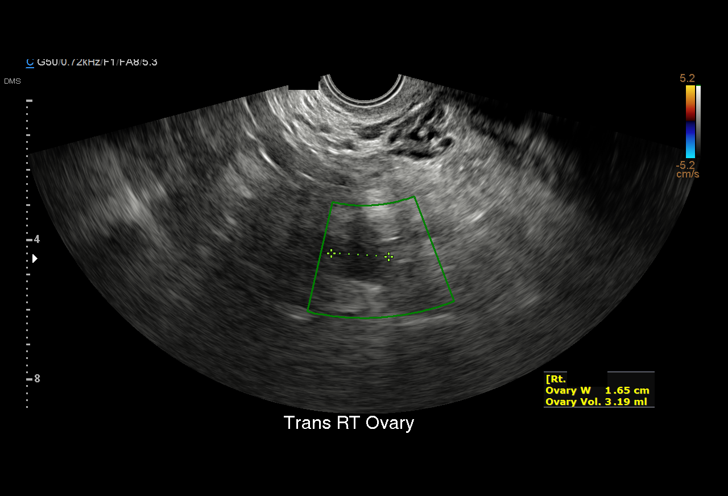
[im 57/57]
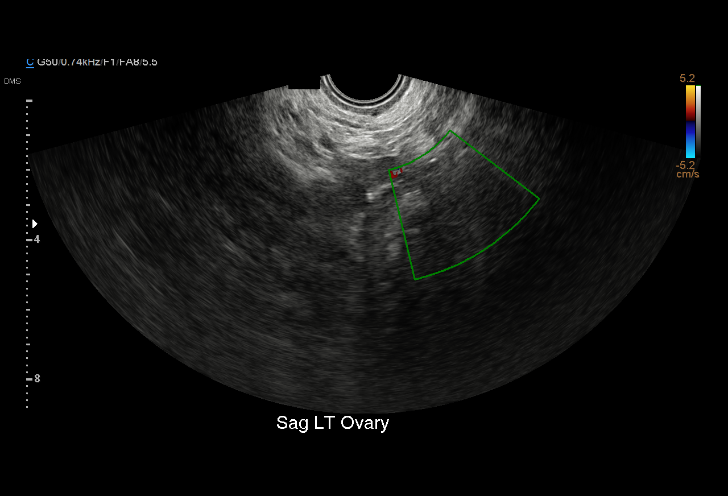

[15 of 25 positions shown; findings below may reference images not displayed]

FINDINGS: Uterus

Measurements: 10.4 x 6.0 x 8.0 cm. Right fundal fibroid measures
cm compared with 2.8 cm on prior study.

Endometrium

Thickness: Normal thickness, 9 mm.  No focal abnormality visualized.

Right ovary

Measurements: 2.0 x 1.8 x 1.7 cm. Normal appearance/no adnexal mass.

Left ovary

Measurements: 2.8 x 2.1 x 2.1 cm. Normal appearance/no adnexal mass.

Other findings

No abnormal free fluid.
IMPRESSION: 4.4 cm right fundal fibroid, enlarged since 2519 when this measured
2.8 cm.

## 2018-10-26 ENCOUNTER — Encounter: Payer: Self-pay | Admitting: Obstetrics and Gynecology

## 2018-10-26 ENCOUNTER — Ambulatory Visit (INDEPENDENT_AMBULATORY_CARE_PROVIDER_SITE_OTHER): Payer: Self-pay | Admitting: Obstetrics and Gynecology

## 2018-10-26 ENCOUNTER — Other Ambulatory Visit: Payer: Self-pay

## 2018-10-26 ENCOUNTER — Other Ambulatory Visit (HOSPITAL_COMMUNITY)
Admission: RE | Admit: 2018-10-26 | Discharge: 2018-10-26 | Disposition: A | Discharge status: Home / Self Care | Payer: Medicaid Other | Source: Ambulatory Visit | Attending: Obstetrics and Gynecology | Admitting: Obstetrics and Gynecology

## 2018-10-26 VITALS — BP 91/69 | HR 73 | Wt 182.0 lb

## 2018-10-26 DIAGNOSIS — N926 Irregular menstruation, unspecified: Secondary | ICD-10-CM | POA: Insufficient documentation

## 2018-10-26 DIAGNOSIS — Z3202 Encounter for pregnancy test, result negative: Secondary | ICD-10-CM

## 2018-10-26 LAB — POCT URINE PREGNANCY: Preg Test, Ur: NEGATIVE

## 2018-10-26 NOTE — Patient Instructions (Signed)

## 2018-10-26 NOTE — Progress Notes (Signed)
ENDOMETRIAL BIOPSY     The indications for endometrial biopsy were reviewed.   Risks of the biopsy including cramping, bleeding, infection, uterine perforation, inadequate specimen and need for additional procedures  were discussed. The patient states she understands and agrees to undergo procedure today. Consent was signed. Time out was performed. Urine HCG was negative. During the pelvic exam, the cervix was prepped with Betadine. A single-toothed tenaculum was placed on the anterior lip of the cervix to stabilize it. The 3 mm pipelle was introduced into the endometrial cavity without difficulty to a depth of 9 cm, and a moderate amount of tissue was obtained and sent to pathology. The instruments were removed from the patient's vagina. Minimal bleeding from the cervix was noted. The patient tolerated the procedure well. Routine post-procedure instructions were given to the patient.

## 2018-11-09 ENCOUNTER — Ambulatory Visit (HOSPITAL_COMMUNITY)
Admission: RE | Admit: 2018-11-09 | Discharge: 2018-11-09 | Disposition: A | Discharge status: Home / Self Care | Payer: Medicaid Other | Source: Ambulatory Visit | Attending: Obstetrics and Gynecology | Admitting: Obstetrics and Gynecology

## 2018-11-09 ENCOUNTER — Encounter (HOSPITAL_COMMUNITY): Payer: Self-pay

## 2018-11-09 ENCOUNTER — Ambulatory Visit
Admission: RE | Admit: 2018-11-09 | Discharge: 2018-11-09 | Disposition: A | Discharge status: Home / Self Care | Payer: Self-pay | Source: Ambulatory Visit | Attending: Obstetrics and Gynecology | Admitting: Obstetrics and Gynecology

## 2018-11-09 VITALS — BP 110/72 | Wt 183.0 lb

## 2018-11-09 DIAGNOSIS — Z1231 Encounter for screening mammogram for malignant neoplasm of breast: Secondary | ICD-10-CM

## 2018-11-09 DIAGNOSIS — Z1239 Encounter for other screening for malignant neoplasm of breast: Secondary | ICD-10-CM

## 2018-11-09 NOTE — Patient Instructions (Addendum)
Explained breast self awareness with Toni Chang. Patient did not need a Pap smear today due to last Pap smear and HPV typing was 08/26/2018. Let her know [facility] will cover Pap smears and HPV typing every 5 years unless has a history of abnormal Pap smears. Referred patient to the [facility] for a screening mammogram. Appointment scheduled for Tuesday, November 09, 2018 at 1510. Patient aware of appointment and will be there. Let [facility] know [facility] will [facility] with her within the next couple weeks with results of mammogram by letter or phone. [place] verbalized understanding.  Toni Chang, Arvil [place]aco, RN 1:49 PM

## 2018-11-09 NOTE — Progress Notes (Signed)
No complaints today.   Pap Smear: Pap smear not completed today. Last Pap smear was 08/26/2018 at the [facility] in [place] and normal with negative HPV. Per patient has no history of an abnormal Pap smear. Last Pap smear result is in Epic.  Physical exam: Breasts Breasts symmetrical. No skin abnormalities bilateral breasts. No nipple retraction bilateral breasts. No nipple discharge bilateral breasts. No lymphadenopathy. No lumps palpated bilateral breasts. No complaints of pain or tenderness on exam. Referred patient to the [facility] for a screening mammogram. Appointment scheduled for Tuesday, November 09, 2018 at 1510.        Pelvic/Bimanual No Pap smear completed today since last Pap smear and HPV typing was 08/26/2018. Pap smear not indicated per BCCCP guidelines.   Smoking History: Patient has never smoked.  Patient Navigation: Patient education provided. Access to services provided for patient through BCCCP program.   Breast and Cervical Cancer Risk Assessment: Patient has no family history of breast cancer, known genetic mutations, or radiation treatment to the chest before age 76. Patient has no history of cervical dysplasia, immunocompromised, or DES exposure in-utero.  Risk Assessment    Risk Scores      11/09/2018   Last edited by: Armond Hang, LPN   5-year risk: 0.8 %   Lifetime risk: 6.9 %

## 2018-11-10 ENCOUNTER — Ambulatory Visit (INDEPENDENT_AMBULATORY_CARE_PROVIDER_SITE_OTHER): Payer: Medicaid Other | Admitting: Obstetrics and Gynecology

## 2018-11-10 ENCOUNTER — Encounter: Payer: Self-pay | Admitting: Obstetrics and Gynecology

## 2018-11-10 VITALS — BP 102/70 | HR 85 | Wt 181.8 lb

## 2018-11-10 DIAGNOSIS — N926 Irregular menstruation, unspecified: Secondary | ICD-10-CM

## 2018-11-10 DIAGNOSIS — D259 Leiomyoma of uterus, unspecified: Secondary | ICD-10-CM

## 2018-11-10 NOTE — Progress Notes (Signed)
Patient ID: Toni Chang, female   DOB: 1971/02/23, 47 y.o.   MRN: [id number] Ms Toni Chang presents for discussion of EMBX. EMBX normal. U/S uterine fibroid, increased in size from 2016 U/S and Bx results reviewed with pt.  PE AF VSS Lungs clear Heart RRR  A/P Irregular cycles        Uterine fibroids  Tx options reviewed with pt. Pt undecided. Information on ablation and hysterectomy provided to pt. Continue with Aygestin for now. Pt to call back with choice.

## 2018-11-10 NOTE — Progress Notes (Signed)
Pt presents to f/u after Endometrial bx

## 2018-11-10 NOTE — Patient Instructions (Signed)
Vaginal Hysterectomy A vaginal hysterectomy is a procedure to remove all or part of the uterus through a small incision in the vagina. In this procedure, your health care provider may remove your entire uterus, including the lower end (cervix). You may need a vaginal hysterectomy to treat:  Uterine fibroids.  A condition that causes the lining of the uterus to grow in other areas (endometriosis).  Problems with pelvic support.  Cancer of the cervix, ovaries, uterus, or tissue that lines the uterus (endometrium).  Excessive (dysfunctional) uterine bleeding.  When removing your uterus, your health care provider may also remove the organs that produce eggs (ovaries) and the tubes that carry eggs to your uterus (fallopian tubes). After a vaginal hysterectomy, you will no longer be able to have a baby. You will also no longer get your menstrual period. Tell a health care provider about:  Any allergies you have.  All medicines you are taking, including vitamins, herbs, eye drops, creams, and over-the-counter medicines.  Any problems you or family members have had with anesthetic medicines.  Any blood disorders you have.  Any surgeries you have had.  Any medical conditions you have.  Whether you are pregnant or may be pregnant. What are the risks? Generally, this is a safe procedure. However, problems may occur, including:  Bleeding.  Infection.  A blood clot that forms in your leg and travels to your lungs (pulmonary embolism).  Damage to surrounding organs.  Pain during sex.  What happens before the procedure?  Ask your health care provider what organs will be removed during surgery.  Ask your health care provider about: ? Changing or stopping your regular medicines. This is especially important if you are taking diabetes medicines or blood thinners. ? Taking medicines such as aspirin and ibuprofen. These medicines can thin your blood. Do not take these medicines before  your procedure if your health care provider instructs you not to.  Follow instructions from your health care provider about eating or drinking restrictions.  Do not use any tobacco products, such as cigarettes, chewing tobacco, and e-cigarettes. If you need help quitting, ask your health care provider.  Plan to have someone take you home after discharge from the hospital. What happens during the procedure?  To reduce your risk of infection: ? Your health care team will wash or sanitize their hands. ? Your skin will be washed with soap.  An IV tube will be inserted into one of your veins.  You may be given antibiotic medicine to help prevent infection.  You will be given one or more of the following: ? A medicine to help you relax (sedative). ? A medicine to numb the area (local anesthetic). ? A medicine to make you fall asleep (general anesthetic). ? A medicine that is injected into an area of your body to numb everything beyond the injection site (regional anesthetic).  Your surgeon will make an incision in your vagina.  Your surgeon will locate and remove all or part of your uterus.  Your ovaries and fallopian tubes may be removed at the same time.  The incision will be closed with stitches (sutures) that dissolve over time. The procedure may vary among health care providers and hospitals. What happens after the procedure?  Your blood pressure, heart rate, breathing rate, and blood oxygen level will be monitored often until the medicines you were given have worn off.  You will be encouraged to get up and walk around after a few hours to help prevent   complications.  You may have IV tubes in place for a few days.  You will be given pain medicine as needed.  Do not drive for 24 hours if you were given a sedative. This information is not intended to replace advice given to you by your health care provider. Make sure you discuss any questions you have with your health care  provider. Document Released: 03/31/2016 Document Revised: 05/15/2016 Document Reviewed: 12/23/2015 Elsevier Interactive Patient Education  2018 [place]. Endometrial Ablation Endometrial ablation is a procedure that destroys the thin inner layer of the lining of the uterus (endometrium). This procedure may be done:  To stop heavy periods.  To stop bleeding that is causing anemia.  To control irregular bleeding.  To treat bleeding caused by small tumors (fibroids) in the endometrium.  This procedure is often an alternative to major surgery, such as removal of the uterus and cervix (hysterectomy). As a result of this procedure:  You may not be able to have children. However, if you are premenopausal (you have not gone through menopause): ? You may still have a small chance of getting pregnant. ? You will need to use a reliable method of birth control after the procedure to prevent pregnancy.  You may stop having a menstrual period, or you may have only a small amount of bleeding during your period. Menstruation may return several years after the procedure.  Tell a health care provider about:  Any allergies you have.  All medicines you are taking, including vitamins, herbs, eye drops, creams, and over-the-counter medicines.  Any problems you or family members have had with the use of anesthetic medicines.  Any blood disorders you have.  Any surgeries you have had.  Any medical conditions you have. What are the risks? Generally, this is a safe procedure. However, problems may occur, including:  A hole (perforation) in the uterus or bowel.  Infection of the uterus, bladder, or vagina.  Bleeding.  Damage to other structures or organs.  An air bubble in the lung (air embolus).  Problems with pregnancy after the procedure.  Failure of the procedure.  Decreased ability to diagnose cancer in the endometrium.  What happens before the procedure?  You will have tests  of your endometrium to make sure there are no pre-cancerous cells or cancer cells present.  You may have an ultrasound of the uterus.  You may be given medicines to thin the endometrium.  Ask your health care provider about: ? Changing or stopping your regular medicines. This is especially important if you take diabetes medicines or blood thinners. ? Taking medicines such as aspirin and ibuprofen. These medicines can thin your blood. Do not take these medicines before your procedure if your doctor tells you not to.  Plan to have someone take you home from the hospital or clinic. What happens during the procedure?  You will lie on an exam table with your feet and legs supported as in a pelvic exam.  To lower your risk of infection: ? Your health care team will wash or sanitize their hands and put on germ-free (sterile) gloves. ? Your genital area will be washed with soap.  An IV tube will be inserted into one of your veins.  You will be given a medicine to help you relax (sedative).  A surgical instrument with a light and camera (resectoscope) will be inserted into your vagina and moved into your uterus. This allows your surgeon to see inside your uterus.  Endometrial tissue will  be removed using one of the following methods: ? Radiofrequency. This method uses a radiofrequency-alternating electric current to remove the endometrium. ? Cryotherapy. This method uses extreme cold to freeze the endometrium. ? Heated-free liquid. This method uses a heated saltwater (saline) solution to remove the endometrium. ? Microwave. This method uses high-energy microwaves to heat up the endometrium and remove it. ? Thermal balloon. This method involves inserting a catheter with a balloon tip into the uterus. The balloon tip is filled with heated fluid to remove the endometrium. The procedure may vary among health care providers and hospitals. What happens after the procedure?  Your blood pressure,  heart rate, breathing rate, and blood oxygen level will be monitored until the medicines you were given have worn off.  As tissue healing occurs, you may notice vaginal bleeding for 4-6 weeks after the procedure. You may also experience: ? Cramps. ? Thin, watery vaginal discharge that is light pink or brown in color. ? A need to urinate more frequently than usual. ? Nausea.  Do not drive for 24 hours if you were given a sedative.  Do not have sex or insert anything into your vagina until your health care provider approves. Summary  Endometrial ablation is done to treat the many causes of heavy menstrual bleeding.  The procedure may be done only after medications have been tried to control the bleeding.  Plan to have someone take you home from the hospital or clinic. This information is not intended to replace advice given to you by your health care provider. Make sure you discuss any questions you have with your health care provider. Document Released: 10/17/2004 Document Revised: 12/25/2016 Document Reviewed: 12/25/2016 Elsevier Interactive Patient Education  2017 [employer].

## 2018-11-15 ENCOUNTER — Telehealth: Payer: Self-pay

## 2018-11-15 NOTE — Telephone Encounter (Signed)
Patient called and states that she is trying to make her final decision on if she will do surgery or not and asked to speak to Dr. Rip Harbour regarding some questions she has, she would like a call back.

## 2018-11-22 ENCOUNTER — Telehealth: Payer: Self-pay

## 2018-11-22 ENCOUNTER — Encounter (HOSPITAL_COMMUNITY): Payer: Self-pay | Admitting: *Deleted

## 2018-11-22 NOTE — Telephone Encounter (Signed)
Spoke with pt and she has additional questions about possible hysterectomy. Advised pt that I would route to provider for review.

## 2018-12-06 NOTE — Telephone Encounter (Signed)
Please have pt schedule appt with me to discuss her concerns about surgery. Thanks Legrand Como

## 2018-12-06 NOTE — Telephone Encounter (Signed)
Left message for pt to return call to office.

## 2018-12-21 ENCOUNTER — Telehealth: Payer: Self-pay

## 2018-12-21 DIAGNOSIS — N926 Irregular menstruation, unspecified: Secondary | ICD-10-CM

## 2018-12-21 MED ORDER — NORETHINDRONE ACETATE 5 MG PO TABS: 5.0000 mg | ORAL_TABLET | Freq: Every day | ORAL | 11 refills | Status: DC

## 2018-12-21 NOTE — Telephone Encounter (Signed)
Contacted pt to advise that provider approved rx to be refilled.

## 2019-03-01 ENCOUNTER — Emergency Department (HOSPITAL_COMMUNITY)
Admission: EM | Admit: 2019-03-01 | Discharge: 2019-03-01 | Disposition: A | Discharge status: Home / Self Care | Payer: No Typology Code available for payment source | Attending: Emergency Medicine | Admitting: Emergency Medicine

## 2019-03-01 ENCOUNTER — Other Ambulatory Visit: Payer: Self-pay

## 2019-03-01 ENCOUNTER — Emergency Department (HOSPITAL_COMMUNITY): Payer: No Typology Code available for payment source

## 2019-03-01 ENCOUNTER — Encounter (HOSPITAL_COMMUNITY): Payer: Self-pay

## 2019-03-01 DIAGNOSIS — Z79899 Other long term (current) drug therapy: Secondary | ICD-10-CM | POA: Diagnosis not present

## 2019-03-01 DIAGNOSIS — R0789 Other chest pain: Secondary | ICD-10-CM | POA: Diagnosis not present

## 2019-03-01 LAB — CBC
HEMATOCRIT: 40 % (ref 36.0–46.0)
Hemoglobin: 13.3 g/dL (ref 12.0–15.0)
MCH: 32 pg (ref 26.0–34.0)
MCHC: 33.3 g/dL (ref 30.0–36.0)
MCV: 96.2 fL (ref 80.0–100.0)
Platelets: 261 10*3/uL (ref 150–400)
RBC: 4.16 MIL/uL (ref 3.87–5.11)
RDW: 12.5 % (ref 11.5–15.5)
WBC: 4.5 10*3/uL (ref 4.0–10.5)
nRBC: 0 % (ref 0.0–0.2)

## 2019-03-01 LAB — BASIC METABOLIC PANEL
ANION GAP: 7 (ref 5–15)
BUN: 12 mg/dL (ref 6–20)
CHLORIDE: 105 mmol/L (ref 98–111)
CO2: 25 mmol/L (ref 22–32)
Calcium: 9.4 mg/dL (ref 8.9–10.3)
Creatinine, Ser: 1.1 mg/dL — ABNORMAL HIGH (ref 0.44–1.00)
GFR calc Af Amer: >60 mL/min (ref >60)
GFR calc non Af Amer: 59 mL/min — ABNORMAL LOW (ref >60)
GLUCOSE: 83 mg/dL (ref 70–99)
Potassium: 4.1 mmol/L (ref 3.5–5.1)
SODIUM: 137 mmol/L (ref 135–145)

## 2019-03-01 LAB — POCT I-STAT TROPONIN I
TROPONIN I, POC: 0 ng/mL (ref 0.00–0.08)
Troponin i, poc: 0 ng/mL (ref 0.00–0.08)

## 2019-03-01 LAB — I-STAT BETA HCG BLOOD, ED (NOT ORDERABLE)

## 2019-03-01 LAB — D-DIMER, QUANTITATIVE: D-Dimer, Quant: 0.52 ug/mL-FEU — ABNORMAL HIGH (ref 0.00–0.50)

## 2019-03-01 MED ORDER — SODIUM CHLORIDE 0.9% FLUSH: 3.0000 mL | Freq: Once | INTRAVENOUS | Status: DC

## 2019-03-01 MED ORDER — IBUPROFEN 600 MG PO TABS: 600.0000 mg | ORAL_TABLET | Freq: Four times a day (QID) | ORAL | 0 refills | Status: DC | PRN

## 2019-03-01 MED ORDER — IOHEXOL 350 MG/ML SOLN
100.0000 mL | Freq: Once | INTRAVENOUS | Status: AC | PRN
  Administered 2019-03-01: 100 mL via INTRAVENOUS

## 2019-03-01 MED ORDER — HYDROCODONE-ACETAMINOPHEN 5-325 MG PO TABS: 1.0000 | ORAL_TABLET | Freq: Four times a day (QID) | ORAL | 0 refills | Status: DC | PRN

## 2019-03-01 MED ORDER — FENTANYL CITRATE (PF) 100 MCG/2ML IJ SOLN
50.0000 ug | Freq: Once | INTRAMUSCULAR | Status: AC
  Administered 2019-03-01: 50 ug via INTRAVENOUS
  Filled 2019-03-01: qty 2

## 2019-03-01 MED ORDER — SODIUM CHLORIDE 0.9 % IV BOLUS
500.0000 mL | Freq: Once | INTRAVENOUS | Status: AC
  Administered 2019-03-01: 500 mL via INTRAVENOUS

## 2019-03-01 MED ORDER — SODIUM CHLORIDE (PF) 0.9 % IJ SOLN
INTRAMUSCULAR | Status: AC
  Filled 2019-03-01: qty 50

## 2019-03-01 MED ORDER — KETOROLAC TROMETHAMINE 30 MG/ML IJ SOLN
30.0000 mg | Freq: Once | INTRAMUSCULAR | Status: AC
  Administered 2019-03-01: 30 mg via INTRAVENOUS
  Filled 2019-03-01: qty 1

## 2019-03-01 NOTE — ED Triage Notes (Signed)
Patient c/o left chest pain /pressure since 1900 last night. Patient denies SOB, N/V/D.

## 2019-03-01 NOTE — Discharge Instructions (Signed)
I feel that your symptoms are related to either chest wall pain or pericarditis.  Take ibuprofen every 6 hours as prescribed.  For severe, breakthrough pain, you can take 1 Norco every 6 hours.  Do not drive or operate machinery while taking this medication.  Please follow-up with your primary care provider and/your cardiology as below.  Please return to the emergency department if you develop any new or worsening symptoms.  Do not drink alcohol, drive, operate machinery or participate in any other potentially dangerous activities while taking opiate pain medication as it may make you sleepy. Do not take this medication with any other sedating medications, either prescription or over-the-counter. If you were prescribed Percocet or Vicodin, do not take these with acetaminophen (Tylenol) as it is already contained within these medications and overdose of Tylenol is dangerous.   This medication is an opiate (or narcotic) pain medication and can be habit forming.  Use it as little as possible to achieve adequate pain control.  Do not use or use it with extreme caution if you have a history of opiate abuse or dependence. This medication is intended for your use only - do not give any to anyone else and keep it in a secure place where nobody else, especially children, have access to it. It will also cause or worsen constipation, so you may want to consider taking an over-the-counter stool softener while you are taking this medication.

## 2019-03-01 NOTE — ED Provider Notes (Signed)
[facility] DEPT Provider Note   CSN: [id number] Arrival date & time: 03/01/19  1258    History   Chief Complaint Chief Complaint  Patient presents with  . Chest Pain    HPI Toni Chang is a 48 y.o. female with history of endometriosis, irregular menses on OCP who presents with 1 day history of left-sided chest pressure.  She reports it is worse on the left side, but does seem to be across her whole chest.  It is pleuritic.  It does not radiate.  She denies any shortness of breath, cough, fever, abdominal pain, nausea, vomiting.  Nothing seems to make the pain better or worse.  It is not exertional.  Patient reports it started when she was leaving work yesterday and has been constant.  She reports having pleurisy several years ago, however this feels different.  Patient reports she has had similar pain to today the past several weeks, however it only normally lasts about 10 minutes and goes away.  She took Tylenol at home without relief.  Patient denies any recent long trips, surgeries, new leg pain or swelling, history of blood clots.  Patient does take an OCP for irregular menses and has been on this since December 2019. No recent illness.     HPI  Past Medical History:  Diagnosis Date  . No pertinent past medical history     Patient Active Problem List   Diagnosis Date Noted  . Uterine fibroid 11/10/2018  . Visit for routine gyn exam 08/26/2018  . BACK PAIN 05/28/2010  . DIZZINESS 02/01/2010  . ENDOMETRIOSIS OF UTERUS 02/01/2010  . OTHER SPECIFIED DISORDER OF SKIN 12/20/2009  . VITAMIN D DEFICIENCY 05/24/2009  . TENDINITIS 05/24/2009  . DIVERTICULITIS OF COLON 12/12/2008  . IRREGULAR MENSES 03/24/2008  . FATIGUE 03/24/2008  . NAUSEA 03/24/2008  . HYPERCHOLESTEROLEMIA 01/12/2008  . ALLERGIC REACTION 12/15/2007  . OTHER ADVERSE FOOD REACTIONS NEC 12/15/2007    Past Surgical History:  Procedure Laterality Date  . DILATION AND  CURETTAGE OF UTERUS  01/2011  . SHOULDER SURGERY  06/2011   right rotator cuff  . TUBAL LIGATION       OB History    Gravida  5   Para  4   Term  4   Preterm  0   AB  1   Living  4     SAB  1   TAB  0   Ectopic  0   Multiple  0   Live Births  4            Home Medications    Prior to Admission medications   Medication Sig Start Date End Date Taking? Authorizing Provider  Multiple Vitamin (MULTIVITAMIN) tablet Take 1 tablet by mouth daily.   Yes [provider]  norethindrone (AYGESTIN) 5 MG tablet Take 1 tablet (5 mg total) by mouth daily. 12/21/18  Yes Chancy Milroy, MD  HYDROcodone-acetaminophen (NORCO/VICODIN) 5-325 MG tablet Take 1 tablet by mouth every 6 (six) hours as needed for severe pain. 03/01/19   Judi Jaffe, Bea Graff, PA-C  ibuprofen (ADVIL,MOTRIN) 600 MG tablet Take 1 tablet (600 mg total) by mouth every 6 (six) hours as needed. 03/01/19   Monice Lundy, Bea Graff, PA-C  naproxen (NAPROSYN) 375 MG tablet Take 1 tablet (375 mg total) by mouth 2 (two) times daily. Patient not taking: Reported on 11/09/2018 08/15/18   Ashley Murrain, NP    Family History Family History  Problem Relation  Age of Onset  . Diabetes Mother   . Diabetes Father   . Anesthesia problems Neg Hx   . Breast cancer Neg Hx     Social History Social History   Tobacco Use  . Smoking status: Never Smoker  . Smokeless tobacco: Never Used  Substance Use Topics  . Alcohol use: No  . Drug use: No     Allergies   Eggs or egg-derived products; Onion; and Tomato   Review of Systems Review of Systems  Constitutional: Negative for chills and fever.  HENT: Negative for facial swelling and sore throat.   Respiratory: Negative for cough and shortness of breath.   Cardiovascular: Positive for chest pain. Negative for leg swelling.  Gastrointestinal: Negative for abdominal pain, nausea and vomiting.  Genitourinary: Negative for dysuria.  Musculoskeletal: Negative for back pain.    Skin: Negative for rash and wound.  Neurological: Negative for headaches.  Psychiatric/Behavioral: The patient is not nervous/anxious.      Physical Exam Updated Vital Signs BP 114/76 (BP Location: Right Arm) Comment: Simultaneous filing. User may not have seen previous data.  Pulse 60 Comment: Simultaneous filing. User may not have seen previous data.  Temp 98.2 F (36.8 C) (Oral)   Resp 16 Comment: Simultaneous filing. User may not have seen previous data.  Ht 5\' 3"  (1.6 m)   Wt 81.6 kg   SpO2 100% Comment: Simultaneous filing. User may not have seen previous data.  BMI 31.89 kg/m   Physical Exam Vitals signs and nursing note reviewed.  Constitutional:      General: She is not in acute distress.    Appearance: She is well-developed. She is not diaphoretic.  HENT:     Head: Normocephalic and atraumatic.     Mouth/Throat:     Pharynx: No oropharyngeal exudate.  Eyes:     General: No scleral icterus.       Right eye: No discharge.        Left eye: No discharge.     Conjunctiva/sclera: Conjunctivae normal.     Pupils: Pupils are equal, round, and reactive to light.  Neck:     Musculoskeletal: Normal range of motion and neck supple.     Thyroid: No thyromegaly.  Cardiovascular:     Rate and Rhythm: Normal rate and regular rhythm.     Heart sounds: Normal heart sounds. No murmur. No friction rub. No gallop.   Pulmonary:     Effort: Pulmonary effort is normal. No respiratory distress.     Breath sounds: Normal breath sounds. No stridor. No wheezing or rales.  Chest:     Chest wall: Tenderness present.  Abdominal:     General: Bowel sounds are normal. There is no distension.     Palpations: Abdomen is soft.     Tenderness: There is no abdominal tenderness. There is no guarding or rebound.  Lymphadenopathy:     Cervical: No cervical adenopathy.  Skin:    General: Skin is warm and dry.     Coloration: Skin is not pale.     Findings: No rash.  Neurological:     Mental  Status: She is alert.     Coordination: Coordination normal.      ED Treatments / Results  Labs (all labs ordered are listed, but only abnormal results are displayed) Labs Reviewed  BASIC METABOLIC PANEL - Abnormal; Notable for the following components:      Result Value   Creatinine, Ser 1.10 (*)    GFR  calc non Af Amer 59 (*)    All other components within normal limits  D-DIMER, QUANTITATIVE (NOT AT [facility]) - Abnormal; Notable for the following components:   D-Dimer, Quant 0.52 (*)    All other components within normal limits  CBC  I-STAT TROPONIN, ED  I-STAT BETA HCG BLOOD, ED ([facility], [facility], [facility] ONLY)  I-STAT TROPONIN, ED  POCT I-STAT TROPONIN I  I-STAT BETA HCG BLOOD, ED (NOT ORDERABLE)  I-STAT TROPONIN, ED  POCT I-STAT TROPONIN I    EKG EKG Interpretation  Date/Time:  Tuesday March 01 2019 13:13:05 EDT Ventricular Rate:  77 PR Interval:    QRS Duration: 72 QT Interval:  341 QTC Calculation: 386 R Axis:   52 Text Interpretation:  Sinus rhythm Low voltage, precordial leads normal R-wave progression, early transition No significant change since last tracing Confirmed by Dorie Rank [id number]) on 03/01/2019 8:28:37 PM   Radiology Dg Chest 2 View  Result Date: 03/01/2019 CLINICAL DATA:  Chest pain EXAM: CHEST - 2 VIEW COMPARISON:  04/21/2016 chest radiograph FINDINGS: The heart size and mediastinal contours are within normal limits. Both lungs are clear. The visualized skeletal structures are unremarkable. IMPRESSION: No active cardiopulmonary disease. Electronically Signed   By: Ulyses Jarred M.D.   On: 03/01/2019 13:50   Ct Angio Chest Pe W And/or Wo Contrast  Result Date: 03/01/2019 CLINICAL DATA:  48 year old female with acute chest pain and elevated D-dimer. EXAM: CT ANGIOGRAPHY CHEST WITH CONTRAST TECHNIQUE: Multidetector CT imaging of the chest was performed using the standard protocol during bolus administration of intravenous contrast. Multiplanar CT image reconstructions  and MIPs were obtained to evaluate the vascular anatomy. CONTRAST:  147mL OMNIPAQUE IOHEXOL 350 MG/ML SOLN COMPARISON:  03/01/2019 and prior chest radiographs FINDINGS: Cardiovascular: This is a technically satisfactory study. No pulmonary emboli are identified. Normal heart size. No thoracic aortic aneurysm or pericardial effusion. Mediastinum/Nodes: No enlarged mediastinal, hilar, or axillary lymph nodes. Thyroid gland, trachea, and esophagus demonstrate no significant findings. Lungs/Pleura: Lungs are clear. No pleural effusion or pneumothorax. Upper Abdomen: No acute abnormality Musculoskeletal: No chest wall abnormality. No acute or significant osseous findings. Review of the MIP images confirms the above findings. IMPRESSION: 1. No acute or significant abnormality. No evidence of pulmonary emboli. Electronically Signed   By: Margarette Canada M.D.   On: 03/01/2019 18:37    Procedures Procedures (including critical care time)  Medications Ordered in ED Medications  sodium chloride flush (NS) 0.9 % injection 3 mL (3 mLs Intravenous Not Given 03/01/19 1825)  sodium chloride (PF) 0.9 % injection (has no administration in time range)  sodium chloride 0.9 % bolus 500 mL (0 mLs Intravenous Stopped 03/01/19 1824)  fentaNYL (SUBLIMAZE) injection 50 mcg (50 mcg Intravenous Given 03/01/19 1605)  iohexol (OMNIPAQUE) 350 MG/ML injection 100 mL (100 mLs Intravenous Contrast Given 03/01/19 1751)  ketorolac (TORADOL) 30 MG/ML injection 30 mg (30 mg Intravenous Given 03/01/19 1914)     Initial Impression / Assessment and Plan / ED Course  I have reviewed the triage vital signs and the nursing notes.  Pertinent labs & imaging results that were available during my care of the patient were reviewed by me and considered in my medical decision making (see chart for details).        Patient with pleuritic chest pain.  Suspect chest wall pain versus pericarditis.  Patient is feeling improved after fentanyl and Toradol.   CT NGO conducted because of elevated d-dimer, which is negative for acute findings and PE.  Delta troponin is negative.  Heart score is 2.  Low suspicion of ACS.  EKG shows NSR and no significant change since last tracing.  Will have patient follow-up with PCP and/or cardiology.  Will discharge home with ibuprofen and short course of Vicodin for breakthrough pain.  Return precautions discussed.  Patient understands and agrees with plan.  Patient vital stable her ED course and discharged in satisfactory condition.  Final Clinical Impressions(s) / ED Diagnoses   Final diagnoses:  Atypical chest pain    ED Discharge Orders         Ordered    ibuprofen (ADVIL,MOTRIN) 600 MG tablet  Every 6 hours PRN     03/01/19 2030    HYDROcodone-acetaminophen (NORCO/VICODIN) 5-325 MG tablet  Every 6 hours PRN     03/01/19 2030           Frederica Kuster, PA-C 03/01/19 2150    Mesner, Corene Cornea, MD 03/01/19 2227

## 2020-10-30 ENCOUNTER — Encounter (INDEPENDENT_AMBULATORY_CARE_PROVIDER_SITE_OTHER): Payer: Self-pay | Admitting: Otolaryngology

## 2020-10-30 ENCOUNTER — Ambulatory Visit (INDEPENDENT_AMBULATORY_CARE_PROVIDER_SITE_OTHER): Payer: BC Managed Care – PPO | Admitting: Otolaryngology

## 2020-10-30 ENCOUNTER — Other Ambulatory Visit: Payer: Self-pay

## 2020-10-30 VITALS — Temp 97.5°F

## 2020-10-30 DIAGNOSIS — H6983 Other specified disorders of Eustachian tube, bilateral: Secondary | ICD-10-CM

## 2020-10-30 DIAGNOSIS — R42 Dizziness and giddiness: Secondary | ICD-10-CM

## 2020-10-30 DIAGNOSIS — H6123 Impacted cerumen, bilateral: Secondary | ICD-10-CM | POA: Diagnosis not present

## 2020-10-30 NOTE — Progress Notes (Signed)
HPI: Toni Chang is a 49 y.o. female who presents is referred by [facility] for evaluation of multitude of ear complaints consisting of history of intermittently losing her hearing.  As well as pressure in [place]  And then symptoms of "popping" in her ears that occur intermittently.  She states that she is hearing well today.  Does not complain of any nasal congestion.. She also complains of occasional dizziness or imbalance that comes and goes.  Denies any vertigo or spinning.  She has not seen her medical doctor concerning this.  Past Medical History:  Diagnosis Date  . No pertinent past medical history    Past Surgical History:  Procedure Laterality Date  . DILATION AND CURETTAGE OF UTERUS  01/2011  . SHOULDER SURGERY  06/2011   right rotator cuff  . TUBAL LIGATION     Social History   Socioeconomic History  . Marital status: Married    Spouse name: Not on file  . Number of children: Not on file  . Years of education: Not on file  . Highest education level: Not on file  Occupational History  . Not on file  Tobacco Use  . Smoking status: Never Smoker  . Smokeless tobacco: Never Used  Vaping Use  . Vaping Use: Never used  Substance and Sexual Activity  . Alcohol use: No  . Drug use: No  . Sexual activity: Yes    Birth control/protection: Pill  Other Topics Concern  . Not on file  Social History Narrative  . Not on file   Social Determinants of Health   Financial Resource Strain:   . Difficulty of Paying Living Expenses: Not on file  Food Insecurity:   . Worried About Charity fundraiser in the Last Year: Not on file  . Ran Out of Food in the Last Year: Not on file  Transportation Needs:   . Lack of Transportation (Medical): Not on file  . Lack of Transportation (Non-Medical): Not on file  Physical Activity:   . Days of Exercise per Week: Not on file  . Minutes of Exercise per Session: Not on file  Stress:   .  Feeling of Stress : Not on file  Social Connections:   . Frequency of Communication with Friends and Family: Not on file  . Frequency of Social Gatherings with Friends and Family: Not on file  . Attends Religious Services: Not on file  . Active Member of Clubs or Organizations: Not on file  . Attends Archivist Meetings: Not on file  . Marital Status: Not on file   Family History  Problem Relation Age of Onset  . Diabetes Mother   . Diabetes Father   . Anesthesia problems Neg Hx   . Breast cancer Neg Hx    Allergies  Allergen Reactions  . Eggs Or Egg-Derived Products Hives  . Onion Hives  . Tomato Hives   Prior to Admission medications   Medication Sig Start Date End Date Taking? Authorizing Provider  HYDROcodone-acetaminophen (NORCO/VICODIN) 5-325 MG tablet Take 1 tablet by mouth every 6 (six) hours as needed for severe pain. 03/01/19  Yes Law, Bea Graff, PA-C  ibuprofen (ADVIL,MOTRIN) 600 MG tablet Take 1 tablet (600 mg total) by mouth every 6 (six) hours as needed. 03/01/19  Yes Law, Bea Graff, PA-C  Multiple Vitamin (MULTIVITAMIN) tablet Take 1 tablet by mouth daily.   Yes [provider]  naproxen (NAPROSYN) 375 MG tablet  Take 1 tablet (375 mg total) by mouth 2 (two) times daily. 08/15/18  Yes Neese, [place], NP  norethindrone (AYGESTIN) 5 MG tablet Take 1 tablet (5 mg total) by mouth daily. 12/21/18  Yes Chancy Milroy, MD     Positive ROS: Otherwise negative  All other systems have been reviewed and were otherwise negative with the exception of those mentioned in the HPI and as above.  Physical Exam: Constitutional: Alert, well-appearing, no acute distress Ears: External ears without lesions or tenderness.  She has mildly small ear canals bilaterally.  She had minimal wax buildup in her ear canals that was cleaned with suction.  The TMs were clear bilaterally with good mobility on pneumatic otoscopy.  Hearing screening with the 512 1024 tuning fork  revealed good hearing in both ears with AC > BC bilaterally.  On Dix-Hallpike testing she had no evidence of BPPV. Nasal: External nose without lesions. Septum midline with moderate rhinitis.  Both middle meatus regions were clear.. Clear nasal passages otherwise. Oral: Lips and gums without lesions. Tongue and palate mucosa without lesions. Posterior oropharynx clear. Neck: No palpable adenopathy or masses Respiratory: Breathing comfortably  Skin: No facial/neck lesions or rash noted.  Cerumen impaction removal  Date/Time: 10/30/2020 3:56 PM Performed by: Rozetta Nunnery, MD Authorized by: Rozetta Nunnery, MD   Consent:    Consent obtained:  Verbal   Consent given by:  Patient   Risks discussed:  Pain and bleeding Procedure details:    Location:  L ear and R ear   Procedure type: suction   Post-procedure details:    Inspection:  TM intact and canal normal   Hearing quality:  Improved   Patient tolerance of procedure:  Tolerated well, no immediate complications Comments:     TMs are clear bilaterally.    Assessment: Minimal wax buildup bilaterally.  TMs are clear bilaterally with good hearing. Popping probably related to eustachian tube dysfunction. Dizziness questionable etiology but no clinical evidence of vestibular abnormality.  Plan: Suggested using Flonase 2 sprays each nostril at night if she is having much popping or pressure problems in the ears. She will follow up here for recheck next time she loses hearing in her ears. Suggested following up with her medical doctor concerning further evaluation of her dizziness as she has no primary ear abnormality noted on clinical exam.   Radene Journey, MD   CC:

## 2023-07-30 ENCOUNTER — Encounter: Payer: Self-pay | Admitting: Physician Assistant

## 2023-07-30 ENCOUNTER — Ambulatory Visit
Admission: EM | Admit: 2023-07-30 | Discharge: 2023-07-30 | Disposition: A | Discharge status: Home / Self Care | Payer: 59 | Attending: Physician Assistant | Admitting: Physician Assistant

## 2023-07-30 DIAGNOSIS — N760 Acute vaginitis: Secondary | ICD-10-CM | POA: Diagnosis not present

## 2023-07-30 DIAGNOSIS — R1030 Lower abdominal pain, unspecified: Secondary | ICD-10-CM | POA: Insufficient documentation

## 2023-07-30 LAB — POCT URINALYSIS DIP (MANUAL ENTRY)
Bilirubin, UA: NEGATIVE
Glucose, UA: NEGATIVE mg/dL
Ketones, POC UA: NEGATIVE mg/dL
Leukocytes, UA: NEGATIVE
Nitrite, UA: NEGATIVE
Protein Ur, POC: NEGATIVE mg/dL
Spec Grav, UA: >=1.03 — AB (ref 1.010–1.025)
Urobilinogen, UA: 0.2 E.U./dL
pH, UA: 5.5 (ref 5.0–8.0)

## 2023-07-30 NOTE — ED Triage Notes (Signed)
"  I am having lower abd pain, sometimes I feel cramps in my vagina with occasional burning". No dysuria. No vaginal discharge "other than normal, none". No fever. No injury. Stools "normal".

## 2023-07-30 NOTE — ED Provider Notes (Signed)
[facility]    CSN: [id number] Arrival date & time: 07/30/23  1327      History   Chief Complaint Chief Complaint  Patient presents with   Abdominal Pain    HPI Toni Chang is a 52 y.o. female.   Patient here today for evaluation of lower abdominal pain with occasional cramps in her vagina and vaginal burning.  Toni Chang states Toni Chang has not had any dysuria.  Toni Chang denies any vaginal discharge.  Toni Chang has not any injury that Toni Chang is aware of.  Toni Chang has had normal bowel movements.  Toni Chang denies any vomiting.  Toni Chang reports vaginal burning seems to be worse after sex.  Denies any genital lesions.  Toni Chang would like STD screening.  The history is provided by the patient.  Abdominal Pain Associated symptoms: no chills, no constipation, no diarrhea, no dysuria, no fever, no nausea, no shortness of breath, no vaginal discharge and no vomiting     Past Medical History:  Diagnosis Date   No pertinent past medical history     Patient Active Problem List   Diagnosis Date Noted   Uterine fibroid 11/10/2018   Visit for routine gyn exam 08/26/2018   BACK PAIN 05/28/2010   DIZZINESS 02/01/2010   ENDOMETRIOSIS OF UTERUS 02/01/2010   OTHER SPECIFIED DISORDER OF SKIN 12/20/2009   VITAMIN D DEFICIENCY 05/24/2009   TENDINITIS 05/24/2009   DIVERTICULITIS OF COLON 12/12/2008   IRREGULAR MENSES 03/24/2008   FATIGUE 03/24/2008   NAUSEA 03/24/2008   HYPERCHOLESTEROLEMIA 01/12/2008   ALLERGIC REACTION 12/15/2007   OTHER ADVERSE FOOD REACTIONS NEC 12/15/2007    Past Surgical History:  Procedure Laterality Date   DILATION AND CURETTAGE OF UTERUS  01/2011   SHOULDER SURGERY  06/2011   right rotator cuff   TUBAL LIGATION      OB History     Gravida  5   Para  4   Term  4   Preterm  0   AB  1   Living  4      SAB  1   IAB  0   Ectopic  0   Multiple  0   Live Births  4            Home Medications    Prior to Admission medications   Medication Sig Start Date  End Date Taking? Authorizing Provider  Multiple Vitamin (MULTIVITAMIN) tablet Take 1 tablet by mouth daily.   Yes [provider]  HYDROcodone-acetaminophen (NORCO/VICODIN) 5-325 MG tablet Take 1 tablet by mouth every 6 (six) hours as needed for severe pain. 03/01/19   Law, Waylan Boga, PA-C  ibuprofen (ADVIL,MOTRIN) 600 MG tablet Take 1 tablet (600 mg total) by mouth every 6 (six) hours as needed. 03/01/19   Law, Waylan Boga, PA-C  naproxen (NAPROSYN) 375 MG tablet Take 1 tablet (375 mg total) by mouth 2 (two) times daily. 08/15/18   Janne Napoleon, NP  norethindrone (AYGESTIN) 5 MG tablet Take 1 tablet (5 mg total) by mouth daily. 12/21/18   Hermina Staggers, MD    Family History Family History  Problem Relation Age of Onset   Diabetes Mother    Diabetes Father    Anesthesia problems Neg Hx    Breast cancer Neg Hx     Social History Social History   Tobacco Use   Smoking status: Never   Smokeless tobacco: Never  Vaping Use   Vaping status: Never Used  Substance Use Topics   Alcohol  use: No   Drug use: No     Allergies   Egg-derived products, Onion, and Tomato   Review of Systems Review of Systems  Constitutional:  Negative for chills and fever.  Eyes:  Negative for discharge and redness.  Respiratory:  Negative for shortness of breath.   Gastrointestinal:  Positive for abdominal pain. Negative for constipation, diarrhea, nausea and vomiting.  Genitourinary:  Negative for dysuria, genital sores and vaginal discharge.     Physical Exam Triage Vital Signs ED Triage Vitals [07/30/23 1333]  Encounter Vitals Group     BP      Systolic BP Percentile      Diastolic BP Percentile      Pulse      Resp      Temp      Temp src      SpO2      Weight 174 lb (78.9 kg)     Height 5\' 3"  (1.6 m)     Head Circumference      Peak Flow      Pain Score 4     Pain Loc      Pain Education      Exclude from Growth Chart    No data found.  Updated Vital Signs BP  96/68 (BP Location: Left Arm)   Pulse 72   Temp 98.1 F (36.7 C) (Oral)   Resp 18   Ht 5\' 3"  (1.6 m)   Wt 174 lb (78.9 kg)   LMP  (Exact Date)   SpO2 97%   BMI 30.82 kg/m      Physical Exam Vitals and nursing note reviewed.  Constitutional:      General: Toni Chang is not in acute distress.    Appearance: Normal appearance. Toni Chang is not ill-appearing.  HENT:     Head: Normocephalic and atraumatic.  Eyes:     Conjunctiva/sclera: Conjunctivae normal.  Cardiovascular:     Rate and Rhythm: Normal rate and regular rhythm.     Heart sounds: Normal heart sounds.  Pulmonary:     Effort: Pulmonary effort is normal. No respiratory distress.     Breath sounds: Normal breath sounds. No wheezing, rhonchi or rales.  Abdominal:     General: Abdomen is flat. Bowel sounds are normal. There is no distension.     Palpations: Abdomen is soft.     Tenderness: There is abdominal tenderness (mild diffuse TTP to lower abdomen). There is no guarding.  Neurological:     Mental Status: Toni Chang is alert.  Psychiatric:        Mood and Affect: Mood normal.        Behavior: Behavior normal.        Thought Content: Thought content normal.      UC Treatments / Results  Labs (all labs ordered are listed, but only abnormal results are displayed) Labs Reviewed  POCT URINALYSIS DIP (MANUAL ENTRY) - Abnormal; Notable for the following components:      Result Value   Spec Grav, UA >=1.030 (*)    Blood, UA trace-intact (*)    All other components within normal limits  CERVICOVAGINAL ANCILLARY ONLY    EKG   Radiology No results found.  Procedures Procedures (including critical care time)  Medications Ordered in UC Medications - No data to display  Initial Impression / Assessment and Plan / UC Course  I have reviewed the triage vital signs and the nursing notes.  Pertinent labs & imaging results that were available during  my care of the patient were reviewed by me and considered in my medical decision  making (see chart for details).    UA without signs of infection.  Will screen for STDs as requested.  Given tenderness on exam recommended further evaluation in the emergency room should abdominal pain worsen.  Discussed alternative diagnosis of atrophic vaginitis and encouraged follow-up with GYN if screening today returns negative and symptoms persist.  Final Clinical Impressions(s) / UC Diagnoses   Final diagnoses:  Lower abdominal pain  Acute vaginitis   Discharge Instructions   None    ED Prescriptions   None    PDMP not reviewed this encounter.   Tomi Bamberger, PA-C 07/30/23 [phone]

## 2023-08-02 ENCOUNTER — Telehealth: Payer: Self-pay

## 2023-08-03 ENCOUNTER — Telehealth (HOSPITAL_COMMUNITY): Payer: Self-pay

## 2023-08-03 MED ORDER — FLUCONAZOLE 150 MG PO TABS: 150.0000 mg | ORAL_TABLET | Freq: Every day | ORAL | 0 refills | Status: AC

## 2023-08-03 MED ORDER — METRONIDAZOLE 500 MG PO TABS: 500.0000 mg | ORAL_TABLET | Freq: Two times a day (BID) | ORAL | 0 refills | Status: DC

## 2023-09-24 ENCOUNTER — Other Ambulatory Visit: Payer: Self-pay | Admitting: Family Medicine

## 2023-09-24 DIAGNOSIS — M2550 Pain in unspecified joint: Secondary | ICD-10-CM | POA: Diagnosis not present

## 2023-09-24 DIAGNOSIS — Z113 Encounter for screening for infections with a predominantly sexual mode of transmission: Secondary | ICD-10-CM | POA: Diagnosis not present

## 2023-09-24 DIAGNOSIS — Z13220 Encounter for screening for lipoid disorders: Secondary | ICD-10-CM | POA: Diagnosis not present

## 2023-09-24 DIAGNOSIS — R5383 Other fatigue: Secondary | ICD-10-CM | POA: Diagnosis not present

## 2023-09-24 DIAGNOSIS — Z1159 Encounter for screening for other viral diseases: Secondary | ICD-10-CM | POA: Diagnosis not present

## 2023-09-24 DIAGNOSIS — Z1231 Encounter for screening mammogram for malignant neoplasm of breast: Secondary | ICD-10-CM

## 2023-09-24 DIAGNOSIS — Z124 Encounter for screening for malignant neoplasm of cervix: Secondary | ICD-10-CM | POA: Diagnosis not present

## 2023-09-24 DIAGNOSIS — Z0000 Encounter for general adult medical examination without abnormal findings: Secondary | ICD-10-CM | POA: Diagnosis not present

## 2023-09-26 ENCOUNTER — Ambulatory Visit: Payer: 59

## 2023-10-01 ENCOUNTER — Ambulatory Visit
Admission: RE | Admit: 2023-10-01 | Discharge: 2023-10-01 | Disposition: A | Discharge status: Home / Self Care | Payer: 59 | Source: Ambulatory Visit | Attending: Family Medicine | Admitting: Family Medicine

## 2023-10-01 DIAGNOSIS — Z1231 Encounter for screening mammogram for malignant neoplasm of breast: Secondary | ICD-10-CM | POA: Diagnosis not present

## 2024-08-23 ENCOUNTER — Other Ambulatory Visit: Payer: Self-pay | Admitting: Obstetrics and Gynecology

## 2024-08-23 ENCOUNTER — Telehealth: Payer: Self-pay

## 2024-08-23 DIAGNOSIS — Z1231 Encounter for screening mammogram for malignant neoplasm of breast: Secondary | ICD-10-CM

## 2024-08-23 NOTE — Telephone Encounter (Signed)
 Telephoned patient at mobile number. Left a voice message with [facility] scheduling contact information.

## 2024-08-25 ENCOUNTER — Encounter

## 2024-10-10 ENCOUNTER — Encounter (HOSPITAL_COMMUNITY): Payer: Self-pay

## 2024-10-10 ENCOUNTER — Other Ambulatory Visit: Payer: Self-pay

## 2024-10-10 ENCOUNTER — Emergency Department (HOSPITAL_COMMUNITY): Payer: Self-pay

## 2024-10-10 ENCOUNTER — Emergency Department (HOSPITAL_COMMUNITY)
Admission: EM | Admit: 2024-10-10 | Discharge: 2024-10-11 | Disposition: A | Discharge status: Home / Self Care | Payer: Self-pay | Attending: Emergency Medicine | Admitting: Emergency Medicine

## 2024-10-10 ENCOUNTER — Encounter: Admitting: Obstetrics and Gynecology

## 2024-10-10 ENCOUNTER — Ambulatory Visit (HOSPITAL_COMMUNITY)
Admission: EM | Admit: 2024-10-10 | Discharge: 2024-10-10 | Disposition: A | Discharge status: Home / Self Care | Payer: Self-pay | Attending: Family Medicine | Admitting: Family Medicine

## 2024-10-10 DIAGNOSIS — H6122 Impacted cerumen, left ear: Secondary | ICD-10-CM | POA: Insufficient documentation

## 2024-10-10 DIAGNOSIS — R42 Dizziness and giddiness: Secondary | ICD-10-CM

## 2024-10-10 LAB — CBC WITH DIFFERENTIAL/PLATELET
Abs Immature Granulocytes: 0.01 K/uL (ref 0.00–0.07)
Basophils Absolute: 0 K/uL (ref 0.0–0.1)
Basophils Relative: 0 %
Eosinophils Absolute: 0.2 K/uL (ref 0.0–0.5)
Eosinophils Relative: 5 %
HCT: 34.8 % — ABNORMAL LOW (ref 36.0–46.0)
Hemoglobin: 11.6 g/dL — ABNORMAL LOW (ref 12.0–15.0)
Immature Granulocytes: 0 %
Lymphocytes Relative: 40 %
Lymphs Abs: 2 K/uL (ref 0.7–4.0)
MCH: 30.8 pg (ref 26.0–34.0)
MCHC: 33.3 g/dL (ref 30.0–36.0)
MCV: 92.3 fL (ref 80.0–100.0)
Monocytes Absolute: 0.5 K/uL (ref 0.1–1.0)
Monocytes Relative: 9 %
Neutro Abs: 2.3 K/uL (ref 1.7–7.7)
Neutrophils Relative %: 46 %
Platelets: 186 K/uL (ref 150–400)
RBC: 3.77 MIL/uL — ABNORMAL LOW (ref 3.87–5.11)
RDW: 12.6 % (ref 11.5–15.5)
WBC: 5 K/uL (ref 4.0–10.5)
nRBC: 0 % (ref 0.0–0.2)

## 2024-10-10 LAB — I-STAT CHEM 8, ED
BUN: 14 mg/dL (ref 6–20)
Calcium, Ion: 1.2 mmol/L (ref 1.15–1.40)
Chloride: 105 mmol/L (ref 98–111)
Creatinine, Ser: 1.1 mg/dL — ABNORMAL HIGH (ref 0.44–1.00)
Glucose, Bld: 100 mg/dL — ABNORMAL HIGH (ref 70–99)
HCT: 33 % — ABNORMAL LOW (ref 36.0–46.0)
Hemoglobin: 11.2 g/dL — ABNORMAL LOW (ref 12.0–15.0)
Potassium: 3.7 mmol/L (ref 3.5–5.1)
Sodium: 142 mmol/L (ref 135–145)
TCO2: 29 mmol/L (ref 22–32)

## 2024-10-10 LAB — POCT FASTING CBG KUC MANUAL ENTRY: POCT Glucose (KUC): 144 mg/dL — AB (ref 70–99)

## 2024-10-10 MED ORDER — KETOROLAC TROMETHAMINE 15 MG/ML IJ SOLN
15.0000 mg | Freq: Once | INTRAMUSCULAR | Status: AC
  Administered 2024-10-10: 15 mg via INTRAVENOUS
  Filled 2024-10-10: qty 1

## 2024-10-10 MED ORDER — METOCLOPRAMIDE HCL 5 MG/ML IJ SOLN
10.0000 mg | Freq: Once | INTRAMUSCULAR | Status: AC
  Administered 2024-10-10: 10 mg via INTRAVENOUS
  Filled 2024-10-10: qty 2

## 2024-10-10 MED ORDER — MECLIZINE HCL 25 MG PO TABS
25.0000 mg | ORAL_TABLET | Freq: Once | ORAL | Status: AC
  Administered 2024-10-10: 25 mg via ORAL
  Filled 2024-10-10: qty 1

## 2024-10-10 MED ORDER — MECLIZINE HCL 25 MG PO TABS: 25.0000 mg | ORAL_TABLET | Freq: Three times a day (TID) | ORAL | 0 refills | Status: AC | PRN

## 2024-10-10 NOTE — ED Triage Notes (Signed)
 Pt from urgent care c/o dizziness, headache, balance issues since yesterday.  bs 144 GCS 15. Vitals wnl. Pt denies medical hx.

## 2024-10-10 NOTE — ED Provider Notes (Signed)
 [facility] AT [facility] Provider Note   CSN: [id number] Arrival date & time: 10/10/24  2004     Patient presents with: Dizziness and Headache   Toni Chang is a 53 y.o. female.   Patient is a 53 year old female with no significant medical problems who is presenting today after being sent by urgent care.  Patient reports her symptoms actually started yesterday but have worsened today.  She reports yesterday afternoon she started having a sensation of dizziness.  She felt that the left ear would feel really congested like there was a lot of pressure in it and she would feel dizzy and have some blurry vision and some feelings of being off balance that would only last a few seconds and then it would resolve.  It did seem to be worse when she would try to walk.  She thought it was just because she was tired and had not been getting as much sleep.  She took a nap and reports it did feel little bit better and then slept all night.  This morning when she got up she felt pretty good was able to go to work but noticed even when she was walking to the bus stop she seemed to be walking with a slight to the right.  Dizziness did seem worse when she would try to get up and walk.  However later today she developed a headache only on the left side of her face with the still intermittent dizziness.  She is mildly photophobic but denies any nausea or vomiting.  She has no neck pain, unilateral numbness or weakness in her face arms or legs.  She has no speech difficulty.  She does not have history of migraines and denies having this before.  No chest pain, shortness of breath, abdominal pain.  The history is provided by the patient.  Dizziness Associated symptoms: headaches   Headache Associated symptoms: dizziness        Prior to Admission medications   Medication Sig Start Date End Date Taking? Authorizing Provider  Multiple Vitamin (MULTIVITAMIN) tablet Take 1 tablet by  mouth daily.    [provider]    Allergies: Egg protein-containing drug products, Onion, Tomato, and Tramadol     Review of Systems  Neurological:  Positive for dizziness and headaches.    Updated Vital Signs BP 122/82 (BP Location: Left Arm)   Pulse (!) 47   Temp 98.1 F (36.7 C) (Oral)   Resp 17   LMP  (LMP Unknown)   SpO2 100%   Physical Exam Vitals and nursing note reviewed.  Constitutional:      General: She is not in acute distress.    Appearance: She is well-developed.  HENT:     Head: Normocephalic and atraumatic.     Right Ear: Tympanic membrane normal.     Left Ear: There is impacted cerumen.     Nose: Nose normal.  Eyes:     General: No visual field deficit.    Pupils: Pupils are equal, round, and reactive to light.  Cardiovascular:     Rate and Rhythm: Normal rate and regular rhythm.     Heart sounds: Normal heart sounds. No murmur heard.    No friction rub.  Pulmonary:     Effort: Pulmonary effort is normal.     Breath sounds: Normal breath sounds. No wheezing or rales.  Abdominal:     General: Bowel sounds are normal. There is no distension.  Palpations: Abdomen is soft.     Tenderness: There is no abdominal tenderness. There is no guarding or rebound.  Musculoskeletal:        General: No tenderness. Normal range of motion.     Cervical back: Normal range of motion and neck supple. No tenderness.     Comments: No edema  Skin:    General: Skin is warm and dry.     Findings: No rash.  Neurological:     Mental Status: She is alert and oriented to person, place, and time.     Cranial Nerves: No cranial nerve deficit, dysarthria or facial asymmetry.     Sensory: Sensation is intact.     Motor: No weakness or pronator drift.     Coordination: Coordination normal. Finger-Nose-Finger Test and Heel to [facility] Test normal.     Comments: Mild l nystagmus while looking to the right with reproduction of symptoms.  5 out of 5 strength in bilateral  upper and lower extremities with intact sensation.  Pronator drift is absent in all 4 extremities  Psychiatric:        Behavior: Behavior normal.     (all labs ordered are listed, but only abnormal results are displayed) Labs Reviewed  CBC WITH DIFFERENTIAL/PLATELET - Abnormal; Notable for the following components:      Result Value   RBC 3.77 (*)    Hemoglobin 11.6 (*)    HCT 34.8 (*)    All other components within normal limits  I-STAT CHEM 8, ED - Abnormal; Notable for the following components:   Creatinine, Ser 1.10 (*)    Glucose, Bld 100 (*)    Hemoglobin 11.2 (*)    HCT 33.0 (*)    All other components within normal limits    EKG: EKG Interpretation Date/Time:  Monday October 10 2024 20:14:29 EDT Ventricular Rate:  53 PR Interval:  143 QRS Duration:  95 QT Interval:  404 QTC Calculation: 380 R Axis:   36  Text Interpretation: Sinus rhythm Low voltage, precordial leads Abnormal R-wave progression, early transition Borderline ST elevation, lateral leads No significant change since last tracing Confirmed by Doretha Folks ([id number]) on 10/10/2024 8:46:04 PM  Radiology: CT Head Wo Contrast Result Date: 10/10/2024 EXAM: CT HEAD WITHOUT CONTRAST 10/10/2024 09:59:29 PM TECHNIQUE: CT of the head was performed without the administration of intravenous contrast. Automated exposure control, iterative reconstruction, and/or weight based adjustment of the mA/kV was utilized to reduce the radiation dose to as low as reasonably achievable. COMPARISON: None available. CLINICAL HISTORY: Headache, new onset (Age >= 51y). FINDINGS: BRAIN AND VENTRICLES: No acute hemorrhage. No evidence of acute infarct. No hydrocephalus. No extra-axial collection. No mass effect or midline shift. ORBITS: No acute abnormality. SINUSES: No acute abnormality. SOFT TISSUES AND SKULL: No acute soft tissue abnormality. No skull fracture. IMPRESSION: 1. No acute intracranial abnormality. Electronically signed by:  Morgane Naveau MD 10/10/2024 10:05 PM EDT RP Workstation: [id number]     Procedures   Medications Ordered in the ED  metoCLOPramide (REGLAN) injection 10 mg (10 mg Intravenous Given 10/10/24 2102)  ketorolac  (TORADOL ) 15 MG/ML injection 15 mg (15 mg Intravenous Given 10/10/24 2101)  meclizine (ANTIVERT) tablet 25 mg (25 mg Oral Given 10/10/24 2103)    Clinical Course as of 10/10/24 2344  Mon Oct 10, 2024  2323 Stable HO WP Dizziness and HA Likely peripheral vertigo. Got treated and being ambulated now.  Was feeling better.  Also vague headache. CT OK [CC]    Clinical Course  User Index [CC] Jerral Meth, MD                                 Medical Decision Making Amount and/or Complexity of Data Reviewed Independent Historian: EMS External Data Reviewed: notes. Labs: ordered. Decision-making details documented in ED Course. Radiology: ordered and independent interpretation performed. Decision-making details documented in ED Course. ECG/medicine tests: ordered and independent interpretation performed. Decision-making details documented in ED Course.  Risk Prescription drug management.   Pt  presenting today with a complaint that caries a high risk for morbidity and mortality. Pt with sx most consistent with peripheral vertigo vs migraine.  No systemic or infectious sx.  Low suspicion for Normal neuro exam without weakness, ataxia or cerebellar findings on exam.  Patient does have nystagmus when looking to the right.  At this time normal vision.  Sx are reproducible with movement of the head and attempting to walk.  No hx of Stroke and low likelihood.  No risk factors and normal VS. Will treat for peripheral vertigo and re-eval.   11:44 PM I independently interpreted patient's labs and Chem-8 without acute findings, CBC is stable.  I have independently visualized and interpreted pt's images today.  Head CT shows no acute findings.  On repeat evaluation patient reports she  still having a mild headache but it does feel better and her dizziness does seem better but will need to walk patient and ensure there is no ataxia       Final diagnoses:  None    ED Discharge Orders     None          Doretha Folks, MD 10/10/24 2344

## 2024-10-10 NOTE — ED Notes (Signed)
 Patient is being discharged from the Urgent Care and sent to the Emergency Department via [facility] . Per Dr. Vonna, patient is in need of higher level of care due to Dizziness. Patient is aware and verbalizes understanding of plan of care.  Vitals:   10/10/24 1900  BP: 110/65  Pulse: 61  Resp: 16  Temp: 98.2 F (36.8 C)  SpO2: 97%

## 2024-10-10 NOTE — ED Provider Notes (Signed)
 [facility]    CSN: [id number] Arrival date & time: 10/10/24  1755      History   Chief Complaint Chief Complaint  Patient presents with   Dizziness    HPI Toni Chang is a 53 y.o. female.    Dizziness  Here for dizziness and lightheadedness that she had some yesterday but improved with rest.  It restarted again this morning and then has worsened all day.  She is having difficulty walking without falling due to the loss of balance.  She has had a frontal headache today also.  She does not take medications for blood pressure or diabetes.  She has had some trouble with blurry vision also today.  She is allergic to tramadol .   Past Medical History:  Diagnosis Date   No pertinent past medical history     Patient Active Problem List   Diagnosis Date Noted   Uterine fibroid 11/10/2018   Visit for routine gyn exam 08/26/2018   Backache 05/28/2010   DIZZINESS 02/01/2010   ENDOMETRIOSIS OF UTERUS 02/01/2010   OTHER SPECIFIED DISORDER OF SKIN 12/20/2009   Vitamin D deficiency 05/24/2009   Enthesopathy 05/24/2009   DIVERTICULITIS OF COLON 12/12/2008   IRREGULAR MENSES 03/24/2008   FATIGUE 03/24/2008   NAUSEA 03/24/2008   HYPERCHOLESTEROLEMIA 01/12/2008   Allergy 12/15/2007   OTHER ADVERSE FOOD REACTIONS NEC 12/15/2007    Past Surgical History:  Procedure Laterality Date   DILATION AND CURETTAGE OF UTERUS  01/2011   SHOULDER SURGERY  06/2011   right rotator cuff   TUBAL LIGATION      OB History     Gravida  5   Para  4   Term  4   Preterm  0   AB  1   Living  4      SAB  1   IAB  0   Ectopic  0   Multiple  0   Live Births  4            Home Medications    Prior to Admission medications   Medication Sig Start Date End Date Taking? Authorizing Provider  Multiple Vitamin (MULTIVITAMIN) tablet Take 1 tablet by mouth daily.    [provider]    Family History Family History  Problem Relation Age of  Onset   Diabetes Mother    Diabetes Father    Anesthesia problems Neg Hx    Breast cancer Neg Hx     Social History Social History   Tobacco Use   Smoking status: Never   Smokeless tobacco: Never  Vaping Use   Vaping status: Never Used  Substance Use Topics   Alcohol use: No   Drug use: No     Allergies   Egg protein-containing drug products, Onion, Tomato, and Tramadol    Review of Systems Review of Systems  Neurological:  Positive for dizziness.     Physical Exam Triage Vital Signs ED Triage Vitals  Encounter Vitals Group     BP 10/10/24 1900 110/65     Girls Systolic BP Percentile --      Girls Diastolic BP Percentile --      Boys Systolic BP Percentile --      Boys Diastolic BP Percentile --      Pulse Rate 10/10/24 1900 61     Resp 10/10/24 1900 16     Temp 10/10/24 1900 98.2 F (36.8 C)     Temp Source 10/10/24 1900 Oral  SpO2 10/10/24 1900 97 %     Weight --      Height --      Head Circumference --      Peak Flow --      Pain Score 10/10/24 1859 0     Pain Loc --      Pain Education --      Exclude from Growth Chart --    No data found.  Updated Vital Signs BP 110/65 (BP Location: Left Arm)   Pulse 61   Temp 98.2 F (36.8 C) (Oral)   Resp 16   LMP  (LMP Unknown)   SpO2 97%   Visual Acuity Right Eye Distance:   Left Eye Distance:   Bilateral Distance:    Right Eye Near:   Left Eye Near:    Bilateral Near:     Physical Exam Vitals reviewed.  Constitutional:      General: She is not in acute distress.    Appearance: She is not ill-appearing, toxic-appearing or diaphoretic.  HENT:     Mouth/Throat:     Mouth: Mucous membranes are moist.  Eyes:     Conjunctiva/sclera: Conjunctivae normal.     Pupils: Pupils are equal, round, and reactive to light.     Comments: There is nystagmus on lateral gaze to the left.  Cardiovascular:     Rate and Rhythm: Normal rate and regular rhythm.  Pulmonary:     Effort: Pulmonary effort is  normal.     Breath sounds: Normal breath sounds.  Skin:    Coloration: Skin is not jaundiced or pale.  Neurological:     General: No focal deficit present.     Mental Status: She is alert and oriented to person, place, and time.  Psychiatric:        Behavior: Behavior normal.      UC Treatments / Results  Labs (all labs ordered are listed, but only abnormal results are displayed) Labs Reviewed  POCT FASTING CBG [facility] MANUAL ENTRY - Abnormal; Notable for the following components:      Result Value   POCT Glucose ([facility]) 144 (*)    All other components within normal limits    EKG   Radiology No results found.  Procedures Procedures (including critical care time)  Medications Ordered in UC Medications - No data to display  Initial Impression / Assessment and Plan / UC Course  I have reviewed the triage vital signs and the nursing notes.  Pertinent labs & imaging results that were available during my care of the patient were reviewed by me and considered in my medical decision making (see chart for details).     Fingerstick glucose is 144.  The loss of balance and vertigo have been fairly constant since and got worse again today.  She is sent to the emergency room by [facility] transport for further evaluation.  She did not have family here to drive her to the emergency room. Final Clinical Impressions(s) / UC Diagnoses   Final diagnoses:  Dizziness     Discharge Instructions      Sugar was 144.  To the ER    ED Prescriptions   None    PDMP not reviewed this encounter.   Vonna Sharlet POUR, MD 10/10/24 [phone]

## 2024-10-10 NOTE — ED Notes (Addendum)
 [facility] notified for transport. Report attempted to [facility] ED with no answer x2.

## 2024-10-10 NOTE — ED Triage Notes (Signed)
 Patient here today with c/o dizziness, lightheadedness, and balance issue since yesterday. Patient states that her symptoms worsened today.

## 2024-10-10 NOTE — Discharge Instructions (Signed)
 Sugar was 144.  To the ER

## 2024-10-11 NOTE — ED Provider Notes (Signed)
 Care of patient received from prior provider at 1:01 AM, please see their note for complete H/P and care plan.  Received handoff per ED course.  Clinical Course as of 10/11/24 0101  Mon Oct 10, 2024  2323 Stable HO WP Dizziness and HA Likely peripheral vertigo. Got treated and being ambulated now.  Was feeling better.  Also vague headache. CT OK [CC]    Clinical Course User Index [CC] Jerral Meth, MD    Reassessment: Competely resolved now after meclizine therapy and observation window. No hx. 2 days of symptoms leading into the presentation today. Patient ambulatory with no further gait disruption or difficulty.  Discussed with patient that symptoms are likely peripheral.  Given resolution of her symptoms at this time, hints exam would be nondiagnostic due to resolution.  Patient feels comfortable with observational care at this time and expressed understanding of return precautions regarding changes in her symptoms.  Did discuss performing an MRI for further workup but patient is requesting discharge as she feels that her symptoms are stable and she feels comfortable following up in the outpatient setting.     Jerral Meth, MD 10/11/24 2257

## 2024-10-11 NOTE — ED Provider Notes (Incomplete)
 Care of patient received from prior provider at 1:01 AM, please see their note for complete H/P and care plan.  Received handoff per ED course.  Clinical Course as of 10/11/24 0101  Mon Oct 10, 2024  2323 Stable HO WP Dizziness and HA Likely peripheral vertigo. Got treated and being ambulated now.  Was feeling better.  Also vague headache. CT OK [CC]    Clinical Course User Index [CC] Jerral Meth, MD    Reassessment: Competely resolved now.  No hx. 2 days of symptoms.  Completely resolved after vertigo treatment.  Now ambulatory tolerating p.o. intake.  Patient feels comfortable discharge.  Called to bedside for patient's requested discharge and had the above conversation

## 2024-10-26 NOTE — Progress Notes (Deleted)
 [name] is a 53 y.o. [id number] female who presents to [facility] clinic today with {Blank single:19197::no complaints,complaint of} ***.    Pap Smear: Pap smear completed today. Last Pap smear was *** at *** clinic and was {Blank single:19197::normal,abnormal - ***}. Per patient has {Blank single:19197::no history,history} of an abnormal Pap smear. Last Pap smear result {Blank single:19197::is available in,is not available in} Epic.   Physical exam: Breasts Breasts symmetrical. No skin abnormalities bilateral breasts. No nipple retraction bilateral breasts. No nipple discharge bilateral breasts. No lymphadenopathy. No lumps palpated bilateral breasts.       Pelvic/Bimanual Ext Genitalia No lesions, no swelling and no discharge observed on external genitalia.        Vagina Vagina pink and normal texture. No lesions or discharge observed in vagina.        Cervix Cervix is present. Cervix pink and of normal texture. No discharge observed.    Uterus Uterus is present and palpable. Uterus in normal position and normal size.        Adnexae Bilateral ovaries present and palpable. No tenderness on palpation.         Rectovaginal No rectal exam completed today since patient had no rectal complaints. No skin abnormalities observed on exam.     Smoking History: Patient has {Blank single:19197::never smoked,is a former smoker,is a current smoker at *** packs per day} ***referred to quit line.    Patient Navigation: Patient education provided. Access to services provided for patient through [facility] program.   Colorectal Cancer Screening: Per patient {Blank single:19197::has had colonoscopy completed on ***,has never had colonoscopy completed} No complaints today.    Breast and Cervical Cancer Risk Assessment: Patient {Blank single:19197::has,does not have} family history of breast cancer, known genetic mutations, or radiation treatment to the chest before age  73. Patient {Blank single:19197::has,does not have} history of cervical dysplasia, immunocompromised, or DES exposure in-utero.  Risk Assessment   No risk assessment data for the current encounter  Risk Scores       11/09/2018   Last edited by: Johnnye Samule DEL, LPN   5-year risk: 0.8%   Lifetime risk: 6.9%            A: [facility] exam with pap smear Complaint of ***  P: Referred patient to the [facility] for a screening mammogram on mobile unit. Appointment scheduled Thursday, October 27, 2024 at 1430.  Driscilla Wanda SQUIBB, RN 10/26/2024 12:47 PM

## 2024-10-27 ENCOUNTER — Ambulatory Visit: Payer: Self-pay

## 2024-10-27 ENCOUNTER — Encounter

## 2025-01-12 ENCOUNTER — Ambulatory Visit: Payer: Self-pay | Admitting: *Deleted

## 2025-01-12 ENCOUNTER — Ambulatory Visit
Admission: RE | Admit: 2025-01-12 | Discharge: 2025-01-12 | Disposition: A | Discharge status: Home / Self Care | Source: Ambulatory Visit | Attending: Obstetrics and Gynecology | Admitting: Obstetrics and Gynecology

## 2025-01-12 VITALS — BP 102/78 | Ht 62.0 in | Wt 171.0 lb

## 2025-01-12 DIAGNOSIS — Z1231 Encounter for screening mammogram for malignant neoplasm of breast: Secondary | ICD-10-CM

## 2025-01-12 DIAGNOSIS — Z1211 Encounter for screening for malignant neoplasm of colon: Secondary | ICD-10-CM

## 2025-01-12 DIAGNOSIS — Z1239 Encounter for other screening for malignant neoplasm of breast: Secondary | ICD-10-CM

## 2025-01-12 NOTE — Patient Instructions (Signed)
 Explained breast self awareness with Sharlet LITTIE Merck. Patient did not need a Pap smear today due to last Pap smear and HPV typing was 09/24/2023. Let her know [facility] will cover Pap smears and HPV typing every 5 years unless has a history of abnormal Pap smears. Referred patient to the [facility] for a screening mammogram on mobile unit. Appointment scheduled Thursday, January 12, 2025 at 1530. Patient aware of appointment and will be there. Let patient know the Breast Center will follow up with her within the next couple weeks with results of mammogram by letter or phone. Sharlet LITTIE Wahid verbalized understanding.  Tamsyn Owusu, Wanda Ship, RN 2:55 PM

## 2025-01-12 NOTE — Progress Notes (Addendum)
 [name] is a 54 y.o. female who presents to [facility] clinic today with no complaints.    Pap Smear: Pap smear not completed today. Last Pap smear was 09/24/2023 at [facility] and was normal with negative HPV. Per patient has no history of an abnormal Pap smear. Last Pap smear result is available in Epic.   Physical exam: Breasts Breasts symmetrical. No skin abnormalities bilateral breasts. No nipple retraction bilateral breasts. No nipple discharge bilateral breasts. No lymphadenopathy. No lumps palpated bilateral breasts. No complaints of pain or tenderness on exam.      MM 3D SCREENING MAMMOGRAM BILATERAL BREAST Result Date: 10/06/2023 CLINICAL DATA:  Screening. EXAM: DIGITAL SCREENING BILATERAL MAMMOGRAM WITH TOMOSYNTHESIS AND CAD TECHNIQUE: Bilateral screening digital craniocaudal and mediolateral oblique mammograms were obtained. Bilateral screening digital breast tomosynthesis was performed. The images were evaluated with computer-aided detection. COMPARISON:  Previous exam(s). ACR Breast Density Category b: There are scattered areas of fibroglandular density. FINDINGS: There are no findings suspicious for malignancy. IMPRESSION: No mammographic evidence of malignancy. A result letter of this screening mammogram will be mailed directly to the patient. RECOMMENDATION: Screening mammogram in one year. (Code:SM-B-01Y) BI-RADS CATEGORY  1: Negative. Electronically Signed   By: Toribio Agreste M.D.   On: 10/06/2023 10:06    Pelvic/Bimanual Pap is not indicated today per [facility]CP guidelines.   Smoking History: Patient has never smoked.   Patient Navigation: Patient education provided. Access to services provided for patient through [facility]CP program.Transportation by Taxi provided to patient to get home.   Colorectal Cancer Screening: Per patient has never had colonoscopy completed. FIT Test given to patient to complete. No complaints today.    Breast and Cervical  Cancer Risk Assessment: Patient does not have family history of breast cancer, known genetic mutations, or radiation treatment to the chest before age 35. Patient does not have history of cervical dysplasia, immunocompromised, or DES exposure in-utero.  Risk Scores as of Encounter on 01/12/2025     Toni Chang           5-year 1.33%   Lifetime 8.02%            Last calculated by Silas, Ansyi K, CMA on 01/12/2025 at  2:52 PM        A: [facility]CP exam without pap smear No complaints.  P: Referred patient to the [facility] for a screening mammogram on mobile unit. Appointment scheduled Thursday, January 12, 2025 at 1530.  Driscilla Wanda SQUIBB, RN 01/12/2025 2:55 PM
